# Patient Record
Sex: Female | Born: 1955 | Race: White | Hispanic: No | Marital: Married | Smoking: Never smoker
Health system: Southern US, Community
[De-identification: ages and names within clinical notes are randomized; demographics above are authoritative.]

## PROBLEM LIST (undated history)

## (undated) DIAGNOSIS — S82891A Other fracture of right lower leg, initial encounter for closed fracture: Secondary | ICD-10-CM

## (undated) DIAGNOSIS — J45909 Unspecified asthma, uncomplicated: Secondary | ICD-10-CM

## (undated) DIAGNOSIS — R87619 Unspecified abnormal cytological findings in specimens from cervix uteri: Secondary | ICD-10-CM

## (undated) DIAGNOSIS — I10 Essential (primary) hypertension: Secondary | ICD-10-CM

## (undated) DIAGNOSIS — R928 Other abnormal and inconclusive findings on diagnostic imaging of breast: Secondary | ICD-10-CM

## (undated) DIAGNOSIS — G5603 Carpal tunnel syndrome, bilateral upper limbs: Secondary | ICD-10-CM

## (undated) DIAGNOSIS — K219 Gastro-esophageal reflux disease without esophagitis: Secondary | ICD-10-CM

## (undated) DIAGNOSIS — E119 Type 2 diabetes mellitus without complications: Secondary | ICD-10-CM

## (undated) HISTORY — PX: WRIST SURGERY: SHX841

## (undated) HISTORY — PX: TONSILLECTOMY: SUR1361

## (undated) HISTORY — PX: OVARIAN CYST REMOVAL: SHX89

## (undated) HISTORY — DX: Type 2 diabetes mellitus without complications: E11.9

## (undated) HISTORY — PX: CERVICAL CONE BIOPSY: SUR198

## (undated) HISTORY — DX: Gastro-esophageal reflux disease without esophagitis: K21.9

## (undated) HISTORY — PX: NECK SURGERY: SHX720

## (undated) HISTORY — DX: Essential (primary) hypertension: I10

## (undated) HISTORY — DX: Unspecified abnormal cytological findings in specimens from cervix uteri: R87.619

## (undated) HISTORY — DX: Unspecified asthma, uncomplicated: J45.909

## (undated) HISTORY — PX: NASAL SEPTUM SURGERY: SHX37

## (undated) HISTORY — DX: Other fracture of right lower leg, initial encounter for closed fracture: S82.891A

## (undated) HISTORY — DX: Carpal tunnel syndrome, bilateral upper limbs: G56.03

## (undated) HISTORY — DX: Other abnormal and inconclusive findings on diagnostic imaging of breast: R92.8

---

## 2007-10-02 ENCOUNTER — Encounter: Admission: RE | Admit: 2007-10-02 | Discharge: 2007-10-02 | Payer: Self-pay | Admitting: Neurological Surgery

## 2007-10-11 ENCOUNTER — Ambulatory Visit (HOSPITAL_COMMUNITY): Admission: RE | Admit: 2007-10-11 | Discharge: 2007-10-12 | Payer: Self-pay | Admitting: Neurological Surgery

## 2007-11-14 ENCOUNTER — Encounter: Admission: RE | Admit: 2007-11-14 | Discharge: 2007-11-14 | Payer: Self-pay | Admitting: Neurological Surgery

## 2008-01-15 ENCOUNTER — Encounter: Admission: RE | Admit: 2008-01-15 | Discharge: 2008-01-15 | Payer: Self-pay | Admitting: Neurological Surgery

## 2009-05-29 ENCOUNTER — Ambulatory Visit: Payer: Self-pay | Admitting: Radiology

## 2009-05-29 ENCOUNTER — Emergency Department (HOSPITAL_BASED_OUTPATIENT_CLINIC_OR_DEPARTMENT_OTHER): Admission: EM | Admit: 2009-05-29 | Discharge: 2009-05-29 | Payer: Self-pay | Admitting: Emergency Medicine

## 2010-06-30 NOTE — Op Note (Signed)
Lacey Johns, Lacey Johns                  ACCOUNT NO.:  [id number]   MEDICAL RECORD NO.:  [id number]          PATIENT TYPE:  OIB   LOCATION:  3599                         FACILITY:  [facility]   PHYSICIAN:  Tia Alert, MD     DATE OF BIRTH:  24-Oct-1955   DATE OF PROCEDURE:  10/11/2007  DATE OF DISCHARGE:                               OPERATIVE REPORT   PREOPERATIVE DIAGNOSIS:  Cervical spondylosis C4-5, C5-6 with neck and  right arm pain.   POSTOPERATIVE DIAGNOSIS:  Cervical spondylosis C4-5, C5-6 with neck and  right arm pain.   PROCEDURES:  1. Decompressive anterior cervical diskectomy C4-5, C5-6.  2. Anterior cervical arthrodesis C4-5, C5-6 utilizing a 6-mm      corticocancellous allograft at C4-5 and a 7-mm corticocancellous      allograft at C5-6.  3. Anterior cervical plating utilizing a 37-1/2 mm Atlantis Venture      plate.   SURGEON:  Tia Alert, MD   ASSISTANT:  Donalee Citrin, MD   ANESTHESIA:  General endotracheal.   COMPLICATIONS:  None.   INDICATION FOR PROCEDURE:  Lacey Johns is a very pleasant 55 year old  female, who is referred with significant neck pain with radiation down  to right arm.  She had an MRI and a CT scan which showed cervical  spondylosis with a reversal of lordosis C4-5, C5-6.  Her worst level is  C5-6.  She had neuroforaminal stenosis C5-6 on the right side.  C6-7  looked clean.  I recommended two-level anterior cervical diskectomy  fusion plating at C4-5, C5-6.  She had tried medical management for  quite sometime without significant relief.  She understood the risks,  benefits, expected outcome and wished to proceed.   DESCRIPTION OF PROCEDURE:  The patient was taken to operating room and  after induction of adequate generalized endotracheal anesthesia, she was  placed in supine position on the operating room table.  Her right  anterior cervical region was prepped with DuraPrep and draped in the  usual sterile fashion.  A 5 mL of local anesthesia  was injected and  transverse incision was made to the right of midline and carried down to  the platysma which was elevated, opened, and undermined.  With  Metzenbaum scissors, I then dissected out medial to the  sternocleidomastoid muscle, internal carotid artery and lateral to the  trachea and esophagus to expose C4-5, C5-6.  Intraoperative fluoroscopy  confirmed my level.  The anterior osteophytes were removed with a  Leksell rongeur at C5-6 and then the longus colli muscles were taken  down and the shadow line retractors were placed under these to expose C4-  5, C5-6 and incised the annulus and the initial diskectomy was done with  pituitary rongeurs and curved curettes.  I then used the 3-mm Kerrison  punch to remove the anterior inferior lip of C4-C5, this widened the  disk space.  I then widened the disk space further utilizing the high-  speed drill, drilling the endplates to prepare for later arthrodesis.  We went to the operating microscope and at each level,  performed the  exact same decompression and opened the posterior longitudinal ligament  with a nerve hook and then removed it in a circumferential fashion while  undercutting the bodies of C4-C5 and C5-C6 in succession.  We marched  along the superior endplates of C5-C6 until we identified the pedicles,  we marched along the pedicles, decompressing the nerve root as we went  along.  Once we had the decompression complete, we could see the cord  pulsatile through the dura and the dura was full and capacious all the  way across.  We palpated in a circumferential fashion with a nerve hook  to assure adequate decompression.  We palpated into the foramina, we  could see each nerve root, nicely palpated and made sure there was no  compression of the nerve roots.  I then measured the interspace to be 6  and 7 mm at C4-5, C5-6 respectively.  I used the respective  corticocancellous allografts and tapped these into position at C4-5,  C5-  6.  I then used a 37-1/2 mm Atlantis Venture plate in placed, two 13-mm  variable angle screws into the bodies of C4, C5, and C6 and these locked  in the plate by locking mechanism within the plate.  I then irrigated  with saline solution, dried all bleeding points with Surgifoam and with  bipolar cautery and once meticulous hemostasis was achieved, closed the  platysma with 3-0 Vicryl, closing subcuticular tissue with 3-0 Vicryl,  and closed the skin with Benzoin and Steri-Strips.  The drapes were  removed.  A sterile dressing was applied.  The patient was awakened from  general anesthesia and transferred to recovery room in stable condition.  At the end of procedure, all sponges, needle and instrument counts were  correct.       Tia Alert, MD  Electronically Signed     DSJ/MEDQ  D:  10/11/2007  T:  10/12/2007  Job:  [id number]

## 2012-02-16 LAB — HM DEXA SCAN: HM DEXA SCAN: NORMAL

## 2013-04-02 ENCOUNTER — Encounter: Payer: Self-pay | Admitting: Family Medicine

## 2013-04-02 ENCOUNTER — Ambulatory Visit (INDEPENDENT_AMBULATORY_CARE_PROVIDER_SITE_OTHER): Payer: 59 | Admitting: Family Medicine

## 2013-04-02 ENCOUNTER — Encounter (INDEPENDENT_AMBULATORY_CARE_PROVIDER_SITE_OTHER): Payer: Self-pay

## 2013-04-02 ENCOUNTER — Ambulatory Visit (HOSPITAL_BASED_OUTPATIENT_CLINIC_OR_DEPARTMENT_OTHER)
Admission: RE | Admit: 2013-04-02 | Discharge: 2013-04-02 | Disposition: A | Discharge status: Home / Self Care | Payer: 59 | Source: Ambulatory Visit | Attending: Family Medicine | Admitting: Family Medicine

## 2013-04-02 VITALS — BP 111/72 | HR 69 | Ht 64.0 in | Wt 170.0 lb

## 2013-04-02 DIAGNOSIS — S99921A Unspecified injury of right foot, initial encounter: Secondary | ICD-10-CM

## 2013-04-02 DIAGNOSIS — S99911A Unspecified injury of right ankle, initial encounter: Secondary | ICD-10-CM

## 2013-04-02 DIAGNOSIS — S92309A Fracture of unspecified metatarsal bone(s), unspecified foot, initial encounter for closed fracture: Secondary | ICD-10-CM | POA: Insufficient documentation

## 2013-04-02 DIAGNOSIS — X58XXXA Exposure to other specified factors, initial encounter: Secondary | ICD-10-CM | POA: Insufficient documentation

## 2013-04-02 DIAGNOSIS — S82899A Other fracture of unspecified lower leg, initial encounter for closed fracture: Secondary | ICD-10-CM | POA: Insufficient documentation

## 2013-04-02 DIAGNOSIS — S8990XA Unspecified injury of unspecified lower leg, initial encounter: Secondary | ICD-10-CM

## 2013-04-02 DIAGNOSIS — S99929A Unspecified injury of unspecified foot, initial encounter: Secondary | ICD-10-CM

## 2013-04-02 DIAGNOSIS — S99919A Unspecified injury of unspecified ankle, initial encounter: Secondary | ICD-10-CM

## 2013-04-02 MED ORDER — HYDROCODONE-ACETAMINOPHEN 5-325 MG PO TABS: 1.0000 | ORAL_TABLET | Freq: Four times a day (QID) | ORAL | Status: DC | PRN

## 2013-04-02 NOTE — Patient Instructions (Signed)
You have avulsion fractures of the base of your 5th metatarsal and lateral malleolus These can take total of 6-8 weeks to recover from. Ice the area for 15 minutes at a time, 3-4 times a day Aleve 2 tabs twice a day with food OR ibuprofen 3 tabs three times a day with food for pain and inflammation. Norco as needed for severe pain (no driving on this or with boot on). Elevate above the level of your heart when possible Crutches if needed to help with walking Bear weight when tolerated Wear cam boot whenever up and walking around. Come out of boot at least a couple times a day to do simple motion exercises of ankle. Follow up in 3-5 weeks (whenever pain is much better in this range).

## 2013-04-04 ENCOUNTER — Encounter: Payer: Self-pay | Admitting: Family Medicine

## 2013-04-04 DIAGNOSIS — S99921A Unspecified injury of right foot, initial encounter: Secondary | ICD-10-CM | POA: Insufficient documentation

## 2013-04-04 DIAGNOSIS — S99911A Unspecified injury of right ankle, initial encounter: Secondary | ICD-10-CM | POA: Insufficient documentation

## 2013-04-04 NOTE — Assessment & Plan Note (Signed)
radiographs show avulsion fractures of the base of the 5th metatarsal and lateral malleolus.  Both should do well with conservative treatment over total of 6-8 weeks.  Icing, nsaids with norco as needed.  Cam walker with weight bearing as tolerated.  F/u in 3-5 weeks.  Consider repeating radiographs depending on how clinically healed she is.

## 2013-04-04 NOTE — Progress Notes (Signed)
Patient ID: Lacey Johns, female   DOB: 06/07/1955, 58 y.o.   MRN: [id number]  PCP: No PCP Per Patient  Subjective:   HPI: Patient is a 58 y.o. female here for right ankle injury.  Patient reports about 3 weeks ago on 1/28 she stepped onto a curb and accidentally rolled her right ankle (inversion). Sharp pain and lots of sounds lateral ankle, foot. Unable to bear weight. Had x-rays at an urgent care that were negative. Has history of sprain of this ankle. Has been elevating, took prednisone for upper respiratory issues. Taking ibuprofen and icing. Still difficulty bearing weight.  Past Medical History  Diagnosis Date  . Asthma   . Diabetes mellitus without complication   . Hypertension     No current outpatient prescriptions on file prior to visit.   No current facility-administered medications on file prior to visit.    Past Surgical History  Procedure Laterality Date  . Tonsillectomy    . Ovarian cyst removal    . Cervical cone biopsy    . Wrist surgery      No Known Allergies  History   Social History  . Marital Status: Married    Spouse Name: N/A    Number of Children: N/A  . Years of Education: N/A   Occupational History  . Not on file.   Social History Main Topics  . Smoking status: Never Smoker   . Smokeless tobacco: Not on file  . Alcohol Use: Not on file  . Drug Use: Not on file  . Sexual Activity: Not on file   Other Topics Concern  . Not on file   Social History Narrative  . No narrative on file    Family History  Problem Relation Age of Onset  . Hypertension Mother   . Hypertension Father   . Hyperlipidemia Father   . Diabetes Father     BP 111/72  Pulse 69  Ht 5\' 4"  (1.626 m)  Wt 170 lb (77.111 kg)  BMI 29.17 kg/m2  Review of Systems: See HPI above.    Objective:  Physical Exam:  Gen: NAD  Right ankle/foot: Mild-mod swelling lateral foot/ankle and dorsal foot.  Some bruising dorsal foot also.  No other  deformity. Mild limitation ROM all directions. TTP over ATFL, lateral malleolus, base 5th MT.  No fibular head, other foot/ankle tenderness. Negative ant drawer and talar tilt.  Pain with talar tilt though.  Negative syndesmotic compression. Thompsons test negative. NV intact distally.    Assessment & Plan:  1. Right foot/ankle injuries - radiographs show avulsion fractures of the base of the 5th metatarsal and lateral malleolus.  Both should do well with conservative treatment over total of 6-8 weeks.  Icing, nsaids with norco as needed.  Cam walker with weight bearing as tolerated.  F/u in 3-5 weeks.  Consider repeating radiographs depending on how clinically healed she is.

## 2013-04-04 NOTE — Assessment & Plan Note (Signed)
radiographs show avulsion fractures of the base of the 5th metatarsal and lateral malleolus.  Both should do well with conservative treatment over total of 6-8 weeks.  Icing, nsaids with norco as needed.  Cam walker with weight bearing as tolerated.  F/u in 3-5 weeks.  Consider repeating radiographs depending on how clinically healed she is. 

## 2013-05-25 ENCOUNTER — Ambulatory Visit (HOSPITAL_BASED_OUTPATIENT_CLINIC_OR_DEPARTMENT_OTHER)
Admission: RE | Admit: 2013-05-25 | Discharge: 2013-05-25 | Disposition: A | Discharge status: Home / Self Care | Payer: 59 | Source: Ambulatory Visit | Attending: Family Medicine | Admitting: Family Medicine

## 2013-05-25 ENCOUNTER — Ambulatory Visit (INDEPENDENT_AMBULATORY_CARE_PROVIDER_SITE_OTHER): Payer: 59 | Admitting: Family Medicine

## 2013-05-25 ENCOUNTER — Encounter (INDEPENDENT_AMBULATORY_CARE_PROVIDER_SITE_OTHER): Payer: Self-pay

## 2013-05-25 ENCOUNTER — Encounter: Payer: Self-pay | Admitting: Family Medicine

## 2013-05-25 VITALS — BP 99/67 | HR 81 | Ht 64.0 in | Wt 170.0 lb

## 2013-05-25 DIAGNOSIS — M25579 Pain in unspecified ankle and joints of unspecified foot: Secondary | ICD-10-CM | POA: Insufficient documentation

## 2013-05-25 DIAGNOSIS — S99911A Unspecified injury of right ankle, initial encounter: Secondary | ICD-10-CM

## 2013-05-25 DIAGNOSIS — M25571 Pain in right ankle and joints of right foot: Secondary | ICD-10-CM

## 2013-05-25 DIAGNOSIS — S99929A Unspecified injury of unspecified foot, initial encounter: Secondary | ICD-10-CM

## 2013-05-25 DIAGNOSIS — S99919A Unspecified injury of unspecified ankle, initial encounter: Secondary | ICD-10-CM

## 2013-05-25 DIAGNOSIS — S8990XA Unspecified injury of unspecified lower leg, initial encounter: Secondary | ICD-10-CM

## 2013-05-25 NOTE — Patient Instructions (Signed)
We will go ahead with a CT scan to assess for a nonunion of this lateral malleolus fracture.   I will contact you with results (typically the next business day, sometimes same day) and next steps.

## 2013-05-28 ENCOUNTER — Encounter: Payer: Self-pay | Admitting: Family Medicine

## 2013-05-28 NOTE — Assessment & Plan Note (Signed)
Repeated ankle radiographs today and does not appear changed now 2 1/2 months out from distal fibula avulsion fracture.  Clinically avulsion of base 5th has healed.  Icing, nsaids with norco as needed.  Unfortunately has not been wearing cam walker regularly.  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  CT scan performed today showed that to be the case.  Will refer to ortho for further treatment discussion (non weight bearing, bone stimulator, ORIF).

## 2013-05-28 NOTE — Progress Notes (Signed)
Patient ID: Lacey Johns, female   DOB: December 01, 1955, 58 y.o.   MRN: [id number]  PCP: No PCP Per Patient  Subjective:   HPI: Patient is a 58 y.o. female here for right ankle injury.  2/16: Patient reports about 3 weeks ago on 1/28 she stepped onto a curb and accidentally rolled her right ankle (inversion). Sharp pain and lots of sounds lateral ankle, foot. Unable to bear weight. Had x-rays at an urgent care that were negative. Has history of sprain of this ankle. Has been elevating, took prednisone for upper respiratory issues. Taking ibuprofen and icing. Still difficulty bearing weight.  4/10: Patient reports her foot feels better. She only used cam walker for 2 weeks however from last visit and then went to using an ace wrap. Has been bearing full weight. Pain worse by end of day.  Past Medical History  Diagnosis Date  . Asthma   . Diabetes mellitus without complication   . Hypertension     Current Outpatient Prescriptions on File Prior to Visit  Medication Sig Dispense Refill  . albuterol (PROVENTIL HFA;VENTOLIN HFA) 108 (90 BASE) MCG/ACT inhaler Inhale into the lungs every 6 (six) hours as needed for wheezing or shortness of breath.      . lisinopril (PRINIVIL,ZESTRIL) 10 MG tablet Take 10 mg by mouth daily.      . pantoprazole (PROTONIX) 20 MG tablet Take 20 mg by mouth daily.       No current facility-administered medications on file prior to visit.    Past Surgical History  Procedure Laterality Date  . Tonsillectomy    . Ovarian cyst removal    . Cervical cone biopsy    . Wrist surgery      No Known Allergies  History   Social History  . Marital Status: Married    Spouse Name: N/A    Number of Children: N/A  . Years of Education: N/A   Occupational History  . Not on file.   Social History Main Topics  . Smoking status: Never Smoker   . Smokeless tobacco: Not on file  . Alcohol Use: Not on file  . Drug Use: Not on file  . Sexual Activity: Not on  file   Other Topics Concern  . Not on file   Social History Narrative  . No narrative on file    Family History  Problem Relation Age of Onset  . Hypertension Mother   . Hypertension Father   . Hyperlipidemia Father   . Diabetes Father     BP 99/67  Pulse 81  Ht 5\' 4"  (1.626 m)  Wt 170 lb (77.111 kg)  BMI 29.17 kg/m2  Review of Systems: See HPI above.    Objective:  Physical Exam:  Gen: NAD  Right ankle/foot: Mild swelling lateral ankle.  No bruising.  No other deformity. Mild limitation ROM all directions. TTP lateral malleolus but no tenderness base 5th MT.  No fibular head, other foot/ankle tenderness. Negative ant drawer and talar tilt.  Negative syndesmotic compression. Thompsons test negative. NV intact distally.    Assessment & Plan:  1. Right foot/ankle injuries - Repeated ankle radiographs today and does not appear changed now 2 1/2 months out from distal fibula avulsion fracture.  Clinically avulsion of base 5th has healed.  Icing, nsaids with norco as needed.  Unfortunately has not been wearing cam walker regularly.  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  CT scan performed today showed that to be the  case.  Will refer to ortho for further treatment discussion (non weight bearing, bone stimulator, ORIF).

## 2013-08-15 ENCOUNTER — Encounter: Payer: Self-pay | Admitting: Internal Medicine

## 2013-08-15 ENCOUNTER — Ambulatory Visit (INDEPENDENT_AMBULATORY_CARE_PROVIDER_SITE_OTHER): Payer: 59 | Admitting: Internal Medicine

## 2013-08-15 VITALS — BP 113/75 | HR 81 | Resp 16 | Ht 64.0 in | Wt 176.0 lb

## 2013-08-15 DIAGNOSIS — J45990 Exercise induced bronchospasm: Secondary | ICD-10-CM

## 2013-08-15 DIAGNOSIS — Z803 Family history of malignant neoplasm of breast: Secondary | ICD-10-CM

## 2013-08-15 DIAGNOSIS — G5603 Carpal tunnel syndrome, bilateral upper limbs: Secondary | ICD-10-CM

## 2013-08-15 DIAGNOSIS — K219 Gastro-esophageal reflux disease without esophagitis: Secondary | ICD-10-CM

## 2013-08-15 DIAGNOSIS — F4322 Adjustment disorder with anxiety: Secondary | ICD-10-CM

## 2013-08-15 DIAGNOSIS — G5600 Carpal tunnel syndrome, unspecified upper limb: Secondary | ICD-10-CM | POA: Insufficient documentation

## 2013-08-15 DIAGNOSIS — I10 Essential (primary) hypertension: Secondary | ICD-10-CM | POA: Insufficient documentation

## 2013-08-15 DIAGNOSIS — C539 Malignant neoplasm of cervix uteri, unspecified: Secondary | ICD-10-CM

## 2013-08-15 DIAGNOSIS — R454 Irritability and anger: Secondary | ICD-10-CM

## 2013-08-15 LAB — COMPREHENSIVE METABOLIC PANEL
ALBUMIN: 4.6 g/dL (ref 3.5–5.2)
ALK PHOS: 83 U/L (ref 39–117)
ALT: 18 U/L (ref 0–35)
AST: 16 U/L (ref 0–37)
BILIRUBIN TOTAL: 0.5 mg/dL (ref 0.2–1.2)
BUN: 17 mg/dL (ref 6–23)
CO2: 28 mEq/L (ref 19–32)
Calcium: 9.9 mg/dL (ref 8.4–10.5)
Chloride: 100 mEq/L (ref 96–112)
Creat: 0.89 mg/dL (ref 0.50–1.10)
GLUCOSE: 97 mg/dL (ref 70–99)
Potassium: 4.1 mEq/L (ref 3.5–5.3)
SODIUM: 138 meq/L (ref 135–145)
TOTAL PROTEIN: 7.1 g/dL (ref 6.0–8.3)

## 2013-08-15 LAB — LIPID PANEL
CHOL/HDL RATIO: 3.8 ratio
Cholesterol: 230 mg/dL — ABNORMAL HIGH (ref 0–200)
HDL: 60 mg/dL (ref >39)
LDL Cholesterol: 136 mg/dL — ABNORMAL HIGH (ref 0–99)
Triglycerides: 169 mg/dL — ABNORMAL HIGH (ref <150)
VLDL: 34 mg/dL (ref 0–40)

## 2013-08-15 LAB — TSH: TSH: 2.501 u[IU]/mL (ref 0.350–4.500)

## 2013-08-15 LAB — CBC WITH DIFFERENTIAL/PLATELET
BASOS ABS: 0.1 10*3/uL (ref 0.0–0.1)
BASOS PCT: 1 % (ref 0–1)
EOS ABS: 0.3 10*3/uL (ref 0.0–0.7)
EOS PCT: 5 % (ref 0–5)
HCT: 43 % (ref 36.0–46.0)
Hemoglobin: 14.7 g/dL (ref 12.0–15.0)
Lymphocytes Relative: 38 % (ref 12–46)
Lymphs Abs: 2.3 10*3/uL (ref 0.7–4.0)
MCH: 29.6 pg (ref 26.0–34.0)
MCHC: 34.2 g/dL (ref 30.0–36.0)
MCV: 86.5 fL (ref 78.0–100.0)
MONO ABS: 0.5 10*3/uL (ref 0.1–1.0)
MONOS PCT: 8 % (ref 3–12)
NEUTROS ABS: 2.9 10*3/uL (ref 1.7–7.7)
Neutrophils Relative %: 48 % (ref 43–77)
PLATELETS: 347 10*3/uL (ref 150–400)
RBC: 4.97 MIL/uL (ref 3.87–5.11)
RDW: 14.1 % (ref 11.5–15.5)
WBC: 6.1 10*3/uL (ref 4.0–10.5)

## 2013-08-15 MED ORDER — LISINOPRIL-HYDROCHLOROTHIAZIDE 10-12.5 MG PO TABS: 1.0000 | ORAL_TABLET | Freq: Every day | ORAL | Status: DC

## 2013-08-15 MED ORDER — PANTOPRAZOLE SODIUM 20 MG PO TBEC: 20.0000 mg | DELAYED_RELEASE_TABLET | Freq: Every day | ORAL | Status: DC

## 2013-08-15 MED ORDER — ESCITALOPRAM OXALATE 5 MG PO TABS: ORAL_TABLET | ORAL | Status: DC

## 2013-08-15 NOTE — Patient Instructions (Signed)
Schedule 3D mammogram at breast center  To lab today    Schedule CPE in 02/2014  Follow up with me in 6-8 weeks  30 mins

## 2013-08-15 NOTE — Progress Notes (Signed)
Subjective:    Patient ID: Lacey Johns, female    DOB: 1955-07-23, 58 y.o.   MRN: [id number]  HPI Lacey Johns is here as a new pt first visit for primary care.  Former care [facility]  PMH of fibrocystic breast disease,  abnromal pap (CA in situ per pt )  Treated with conization, HTN, GERD, exercise induced asthma, bilateral carpal tunnel.    She has a strong FH of brest cancer in mother, maternal aunt and MGM.    She has multiple concerns today.  She believes she feels a lump under right axilla.  Been present many months and she states it goes up and down in size.   She cannot feel it today.   She also has some pain "around by belly button" and wonders if she has a hernia.  She is pain free now  She has multiple stresses  Lots of financial stress husband had lost job but recently restarted,  1 child in college and 1 to start.  Very stressful job with demanding boss.  Lots of irritability  "hard to shut down at night".    She denies daily derpessed mood  No S/H ideation  No Known Allergies Past Medical History  Diagnosis Date  . Asthma   . Diabetes mellitus without complication   . Hypertension   . Carpal tunnel syndrome, bilateral   . Ankle fracture, right   . Abnormal mammogram of right breast   . Abnormal Pap smear of cervix   . GERD (gastroesophageal reflux disease)    Past Surgical History  Procedure Laterality Date  . Tonsillectomy    . Ovarian cyst removal    . Cervical cone biopsy    . Wrist surgery    . Nasal septum surgery    . Neck surgery     History   Social History  . Marital Status: Married    Spouse Name: N/A    Number of Children: N/A  . Years of Education: N/A   Occupational History  . Not on file.   Social History Main Topics  . Smoking status: Never Smoker   . Smokeless tobacco: Not on file  . Alcohol Use: No  . Drug Use: No  . Sexual Activity: Yes    Partners: Male   Other Topics Concern  . Not on file   Social History Narrative  . No  narrative on file   Family History  Problem Relation Age of Onset  . Hypertension Mother   . Hypertension Father   . Hyperlipidemia Father   . Diabetes Father   . Breast cancer Maternal Aunt   . ALS Maternal Aunt   . Parkinson's disease Maternal Aunt    Patient Active Problem List   Diagnosis Date Noted  . Right ankle injury 04/04/2013  . Right foot injury 04/04/2013   Current Outpatient Prescriptions on File Prior to Visit  Medication Sig Dispense Refill  . albuterol (PROVENTIL HFA;VENTOLIN HFA) 108 (90 BASE) MCG/ACT inhaler Inhale into the lungs every 6 (six) hours as needed for wheezing or shortness of breath.      . lisinopril (PRINIVIL,ZESTRIL) 10 MG tablet Take 10 mg by mouth daily.       No current facility-administered medications on file prior to visit.       Review of Systems See HPI    Objective:   Physical Exam Physical Exam  Nursing note and vitals reviewed.  Constitutional: She is oriented to person, place, and time. She appears well-developed  and well-nourished.  HENT:  Head: Normocephalic and atraumatic.  Cardiovascular: Normal rate and regular rhythm. Exam reveals no gallop and no friction rub.  No murmur heard.  Pulmonary/Chest: Breath sounds normal. She has no wheezes. She has no rales.  Breast  Careful exam of both breast and bilateral axillary areas reveal no mass no nipple discharge and I cannot palpate any axillary mass or lymphaedenopathy is either breast  Abd .  She has diastasis recti.  No peri-umbilical herniation on exam.  BS Pos  No HSM Neurological: She is alert and oriented to person, place, and time.  Skin: Skin is warm and dry.  Psychiatric: She has a normal mood and affect. Her behavior is normal.              Assessment & Plan:  HTN  Continue meds  Irritabilty  /situational stress/ anxious mood  Will try lexapro 5 mg  Daily for two weeks, then 10 mg  Screening strong FH of breast CA  Will schedule 3 D mm.  I cannot  palpate any axillary mass  GERD  She had been using OTC will start Protonix  20 mg daily    Abnormal pap ?? CA in situ of cervix.  Will need to schedule  CPe for pap Get labs today return 6-8 weeks

## 2013-08-16 ENCOUNTER — Encounter: Payer: Self-pay | Admitting: *Deleted

## 2013-08-16 LAB — VITAMIN D 25 HYDROXY (VIT D DEFICIENCY, FRACTURES): Vit D, 25-Hydroxy: 57 ng/mL (ref 30–89)

## 2013-08-23 ENCOUNTER — Ambulatory Visit (HOSPITAL_COMMUNITY): Payer: 59

## 2013-08-27 ENCOUNTER — Ambulatory Visit (HOSPITAL_COMMUNITY)
Admission: RE | Admit: 2013-08-27 | Discharge: 2013-08-27 | Disposition: A | Discharge status: Home / Self Care | Payer: 59 | Source: Ambulatory Visit | Attending: Internal Medicine | Admitting: Internal Medicine

## 2013-08-27 DIAGNOSIS — Z1231 Encounter for screening mammogram for malignant neoplasm of breast: Secondary | ICD-10-CM | POA: Insufficient documentation

## 2013-08-31 ENCOUNTER — Encounter: Payer: Self-pay | Admitting: *Deleted

## 2013-09-10 ENCOUNTER — Ambulatory Visit: Payer: Self-pay | Admitting: Internal Medicine

## 2013-10-01 ENCOUNTER — Ambulatory Visit (INDEPENDENT_AMBULATORY_CARE_PROVIDER_SITE_OTHER): Payer: 59 | Admitting: Internal Medicine

## 2013-10-01 ENCOUNTER — Encounter: Payer: Self-pay | Admitting: Internal Medicine

## 2013-10-01 VITALS — BP 104/67 | HR 66 | Temp 98.1°F | Resp 16 | Ht 64.0 in | Wt 178.0 lb

## 2013-10-01 DIAGNOSIS — M545 Low back pain, unspecified: Secondary | ICD-10-CM

## 2013-10-01 DIAGNOSIS — S39012A Strain of muscle, fascia and tendon of lower back, initial encounter: Secondary | ICD-10-CM

## 2013-10-01 DIAGNOSIS — R454 Irritability and anger: Secondary | ICD-10-CM

## 2013-10-01 DIAGNOSIS — F4322 Adjustment disorder with anxiety: Secondary | ICD-10-CM

## 2013-10-01 MED ORDER — ESCITALOPRAM OXALATE 5 MG PO TABS
ORAL_TABLET | ORAL | Status: DC
Start: 2013-10-01 — End: 2014-03-01

## 2013-10-01 MED ORDER — CYCLOBENZAPRINE HCL 10 MG PO TABS: ORAL_TABLET | ORAL | Status: DC

## 2013-10-01 NOTE — Progress Notes (Signed)
Subjective:    Patient ID: Lacey Johns, female    DOB: [birth year], 58 y.o.   MRN: [id number]  HPI  Lacey Johns is here for follow up   I had initiated Lexapro last visit for anxiety/irritability and she tells me she is 80% improved.  Much better  See cholesterol  She does not want RX  Meds.  Framingham risk score 4.8%  She reports she was seen at [facility] for low back pain  I do not have records  - she reports urine test Ok .  Was given muscle relaxant but pain still present .  Located L/S area occasionally will radiate upward.  No radiation down either leg.   No paresthesias or muscle weakness.   Flexeril has worked for her in the past  She states she cannot lose weight despite diet .   Hard to exercise now.         No Known Allergies Past Medical History  Diagnosis Date  . Asthma   . Diabetes mellitus without complication   . Hypertension   . Carpal tunnel syndrome, bilateral   . Ankle fracture, right   . Abnormal mammogram of right breast   . Abnormal Pap smear of cervix   . GERD (gastroesophageal reflux disease)    Past Surgical History  Procedure Laterality Date  . Tonsillectomy    . Ovarian cyst removal    . Cervical cone biopsy    . Wrist surgery    . Nasal septum surgery    . Neck surgery     History   Social History  . Marital Status: Married    Spouse Name: Lacey Johns    Number of Children: Lacey Johns  . Years of Education: Lacey Johns   Occupational History  . Not on file.   Social History Main Topics  . Smoking status: Never Smoker   . Smokeless tobacco: Not on file  . Alcohol Use: No  . Drug Use: No  . Sexual Activity: Yes    Partners: Male   Other Topics Concern  . Not on file   Social History Narrative  . No narrative on file   Family History  Problem Relation Age of Onset  . Hypertension Mother   . Hypertension Father   . Hyperlipidemia Father   . Diabetes Father   . Breast cancer Maternal Aunt   . ALS Maternal Aunt   . Parkinson's disease Maternal Aunt     Patient Active Problem List   Diagnosis Date Noted  . HTN (hypertension) 08/15/2013  . Irritability 08/15/2013  . Adjustment disorder with anxious mood 08/15/2013  . FH: breast cancer in first degree relative  Mother , GM, maunt 08/15/2013  . 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 GERD (gastroesophageal reflux disease) 08/15/2013  . Exercise-induced asthma 08/15/2013  . Carpal tunnel syndrome 08/15/2013  . Right ankle injury 04/04/2013  . Right foot injury 04/04/2013   Current Outpatient Prescriptions on File Prior to Visit  Medication Sig Dispense Refill  . albuterol (PROVENTIL HFA;VENTOLIN HFA) 108 (90 BASE) MCG/ACT inhaler Inhale into the lungs every 6 (six) hours as needed for wheezing or shortness of breath.      . Calcium Citrate-Vitamin D (CITRACAL/VITAMIN D) 250-200 MG-UNIT TABS Take 2 tablets by mouth daily.      . cyclobenzaprine (FLEXERIL) 10 MG tablet Take 10 mg by mouth 3 (three) times daily as needed for muscle spasms.      Marland Kitchen escitalopram (LEXAPRO)  5 MG tablet Take one tablet daily for 2 weeks then 2 tablets daily  60 tablet  1  . lisinopril (PRINIVIL,ZESTRIL) 10 MG tablet Take 10 mg by mouth daily.      Marland Kitchen lisinopril-hydrochlorothiazide (PRINZIDE,ZESTORETIC) 10-12.5 MG per tablet Take 1 tablet by mouth daily.  90 tablet  0  . pantoprazole (PROTONIX) 20 MG tablet Take 1 tablet (20 mg total) by mouth daily.  30 tablet  2  . valACYclovir (VALTREX) 500 MG tablet Take 500 mg by mouth 2 (two) times daily.      . Vitamin D, Ergocalciferol, (DRISDOL) 50000 UNITS CAPS capsule Take 50,000 Units by mouth every 7 (seven) days.       No current facility-administered medications on file prior to visit.      Review of Systems See HPI    Objective:   Physical Exam  Physical Exam  Nursing note and vitals reviewed.  Constitutional: She is oriented to person, place, and time. She appears well-developed and well-nourished.  HENT:  Head:  Normocephalic and atraumatic.  Cardiovascular: Normal rate and regular rhythm. Exam reveals no gallop and no friction rub.  No murmur heard.  Pulmonary/Chest: Breath sounds normal. She has no wheezes. She has no rales.  Neurological: She is alert and oriented to person, place, and time.  Skin: Skin is warm and dry.  Neurologic  LE:  SLR neg bilaterally Reflexes LE:  2+ symmetric Motor :  5/5 LE groups tested Sensory intact microfilament and symmetric  Psychiatric: She has a normal mood and affect. Her behavior is normal.        Assessment & Plan:  Situational anxiety/irritability:  Continue Lexapro 10 mg daily  Hyperlipidemia  Dash diet given  Will moniter   Low back strain  [facility] for flexeril bid prn  Alleve daily for next 10 days.    Will set up with PT    She has seen Dr. Barbaraann Barthel  In the past and I advised to make follow up appt with him .    See me as needed

## 2013-10-01 NOTE — Patient Instructions (Signed)
To go to Pt today   To schedule appt with Dr. Barbaraann Barthel  Pt to make appt    See me as needed

## 2013-12-17 ENCOUNTER — Other Ambulatory Visit: Payer: Self-pay | Admitting: *Deleted

## 2013-12-17 ENCOUNTER — Encounter: Payer: Self-pay | Admitting: Internal Medicine

## 2013-12-17 MED ORDER — CYCLOBENZAPRINE HCL 10 MG PO TABS: ORAL_TABLET | ORAL | Status: DC

## 2013-12-17 MED ORDER — LISINOPRIL-HYDROCHLOROTHIAZIDE 10-12.5 MG PO TABS: 1.0000 | ORAL_TABLET | Freq: Every day | ORAL | Status: DC

## 2013-12-17 NOTE — Telephone Encounter (Signed)
Refill request

## 2014-02-24 NOTE — Progress Notes (Signed)
Subjective:    Patient ID: Lacey Johns, female    DOB: Oct 19, 1955, 59 y.o.   MRN: [id number]  HPI 09/2013 Situational anxiety/irritability: Continue Lexapro 10 mg daily  Hyperlipidemia Dash diet given Will moniter   Low back strain [facility] for flexeril bid prn Alleve daily for next 10 days. Will set up with PT  She has seen Dr. Barbaraann Barthel In the past and I advised to make follow up appt with him .   See me as needed     TODAY:  Juliann Pulse is here for  CPE  HM:  Anxiety  Tolerating Lexapro  No Known Allergies Past Medical History  Diagnosis Date  . Asthma   . Diabetes mellitus without complication   . Hypertension   . Carpal tunnel syndrome, bilateral   . Ankle fracture, right   . Abnormal mammogram of right breast   . Abnormal Pap smear of cervix   . GERD (gastroesophageal reflux disease)    Past Surgical History  Procedure Laterality Date  . Tonsillectomy    . Ovarian cyst removal    . Cervical cone biopsy    . Wrist surgery    . Nasal septum surgery    . Neck surgery     History   Social History  . Marital Status: Married    Spouse Name: N/A    Number of Children: N/A  . Years of Education: N/A   Occupational History  . Not on file.   Social History Main Topics  . Smoking status: Never Smoker   . Smokeless tobacco: Not on file  . Alcohol Use: No  . Drug Use: No  . Sexual Activity:    Partners: Male   Other Topics Concern  . Not on file   Social History Narrative   Family History  Problem Relation Age of Onset  . Hypertension Mother   . Hypertension Father   . Hyperlipidemia Father   . Diabetes Father   . Breast cancer Maternal Aunt   . ALS Maternal Aunt   . Parkinson's disease Maternal Aunt    Patient Active Problem List   Diagnosis Date Noted  . HTN (hypertension) 08/15/2013  . Irritability 08/15/2013  . Adjustment disorder with anxious mood 08/15/2013  . FH: breast cancer in first degree relative  Mother , GM, maunt 08/15/2013    . Cervical cancer  pt reports CA in situ S/P conization college age 59/02/2013  . GERD (gastroesophageal reflux disease) 08/15/2013  . Exercise-induced asthma 08/15/2013  . Carpal tunnel syndrome 08/15/2013  . Right ankle injury 04/04/2013  . Right foot injury 04/04/2013   Current Outpatient Prescriptions on File Prior to Visit  Medication Sig Dispense Refill  . albuterol (PROVENTIL HFA;VENTOLIN HFA) 108 (90 BASE) MCG/ACT inhaler Inhale into the lungs every 6 (six) hours as needed for wheezing or shortness of breath.    . Calcium Citrate-Vitamin D (CITRACAL/VITAMIN D) 250-200 MG-UNIT TABS Take 2 tablets by mouth daily.    . cyclobenzaprine (FLEXERIL) 10 MG tablet Take 1/2 or one whole tablet bid for muscle spasms 30 tablet 1  . escitalopram (LEXAPRO) 5 MG tablet Take two daily 60 tablet 4  . lisinopril (PRINIVIL,ZESTRIL) 10 MG tablet Take 10 mg by mouth daily.    Marland Kitchen lisinopril-hydrochlorothiazide (PRINZIDE,ZESTORETIC) 10-12.5 MG per tablet Take 1 tablet by mouth daily. 90 tablet 1  . pantoprazole (PROTONIX) 20 MG tablet Take 1 tablet (20 mg total) by mouth daily. 30 tablet 2  . valACYclovir (VALTREX) 500 MG tablet Take  500 mg by mouth 2 (two) times daily.    . Vitamin D, Ergocalciferol, (DRISDOL) 50000 UNITS CAPS capsule Take 50,000 Units by mouth every 7 (seven) days.     No current facility-administered medications on file prior to visit.       Review of Systems  Respiratory: Negative for cough, chest tightness, shortness of breath, wheezing and stridor.   Cardiovascular: Negative for chest pain, palpitations and leg swelling.  All other systems reviewed and are negative.      Objective:   Physical Exam Physical Exam  Vital signs and nursing note reviewed  Constitutional: She is oriented to person, place, and time. She appears well-developed and well-nourished. She is cooperative.  HENT:  Head: Normocephalic and atraumatic.  Right Ear: Tympanic membrane normal.  Left Ear:  Tympanic membrane normal.  Nose: Nose normal.  Mouth/Throat: Oropharynx is clear and moist and mucous membranes are normal. No oropharyngeal exudate or posterior oropharyngeal erythema.  Eyes: Conjunctivae and EOM are normal. Pupils are equal, round, and reactive to light.  Neck: Neck supple. No JVD present. Carotid bruit is not present. No mass and no thyromegaly present.  Cardiovascular: Regular rhythm, normal heart sounds, intact distal pulses and normal pulses.  Exam reveals no gallop and no friction rub.   No murmur heard. Pulses:      Dorsalis pedis pulses are 2+ on the right side, and 2+ on the left side.  Pulmonary/Chest: Breath sounds normal. She has no wheezes. She has no rhonchi. She has no rales. Right breast exhibits no mass, no nipple discharge and no skin change. Left breast exhibits no mass, no nipple discharge and no skin change.  Abdominal: Soft. Bowel sounds are normal. She exhibits no distension and no mass. There is no hepatosplenomegaly. There is no tenderness. There is no CVA tenderness.  Genitourinary: Rectum normal, vagina normal and uterus normal. Rectal exam shows no mass. Guaiac negative stool. No labial fusion. There is no lesion on the right labia. There is no lesion on the left labia. Cervix exhibits no motion tenderness. Right adnexum displays no mass, no tenderness and no fullness. Left adnexum displays no mass, no tenderness and no fullness. No erythema around the vagina.  Musculoskeletal:       No active synovitis to any joint.    Lymphadenopathy:       Right cervical: No superficial cervical adenopathy present.      Left cervical: No superficial cervical adenopathy present.       Right axillary: No pectoral and no lateral adenopathy present.       Left axillary: No pectoral and no lateral adenopathy present.      Right: No inguinal adenopathy present.       Left: No inguinal adenopathy present.  Neurological: She is alert and oriented to person, place, and  time. She has normal strength and normal reflexes. No cranial nerve deficit or sensory deficit. She displays a negative Romberg sign. Coordination and gait normal.  Skin: Skin is warm and dry. No abrasion, no bruising, no ecchymosis and no rash noted. No cyanosis. Nails show no clubbing.  Psychiatric: She has a normal mood and affect. Her speech is normal and behavior is normal.          Assessment & Plan:   HM       Assessment & Plan:

## 2014-02-25 ENCOUNTER — Encounter: Payer: Self-pay | Admitting: Internal Medicine

## 2014-03-01 ENCOUNTER — Other Ambulatory Visit: Payer: Self-pay | Admitting: *Deleted

## 2014-03-01 NOTE — Telephone Encounter (Signed)
Refill request

## 2014-03-03 MED ORDER — CYCLOBENZAPRINE HCL 10 MG PO TABS: ORAL_TABLET | ORAL | Status: DC

## 2014-03-03 MED ORDER — LISINOPRIL-HYDROCHLOROTHIAZIDE 10-12.5 MG PO TABS: 1.0000 | ORAL_TABLET | Freq: Every day | ORAL | Status: DC

## 2014-03-03 MED ORDER — ESCITALOPRAM OXALATE 5 MG PO TABS: ORAL_TABLET | ORAL | Status: DC

## 2014-03-04 ENCOUNTER — Other Ambulatory Visit: Payer: Self-pay | Admitting: *Deleted

## 2014-03-04 NOTE — Telephone Encounter (Signed)
Refill request

## 2014-03-05 MED ORDER — PANTOPRAZOLE SODIUM 20 MG PO TBEC: 20.0000 mg | DELAYED_RELEASE_TABLET | Freq: Every day | ORAL | Status: DC

## 2014-03-05 MED ORDER — CYCLOBENZAPRINE HCL 10 MG PO TABS: ORAL_TABLET | ORAL | Status: DC

## 2014-03-10 NOTE — Progress Notes (Signed)
Subjective:    Patient ID: Lacey Johns, female    DOB: [birth year], 59 y.o.   MRN: [id number]  HPI 09/2013  Situational anxiety/irritability: Continue Lexapro 10 mg daily  Hyperlipidemia Dash diet given Will moniter   Low back strain [facility] for flexeril bid prn Alleve daily for next 10 days. Will set up with PT  She has seen Dr. Barbaraann Barthel In the past and I advised to make follow up appt with him .   See me as needed.    TODAY:  [name] returns for follow up .  She states she has noticed some increasing irritability and would like to try a higher dose of Lexapro.   Flexeril working as needed for back strain  Hyperlipdemia  Will check at 12 months   GERD  On Protonix  No Known Allergies Past Medical History  Diagnosis Date  . Asthma   . Diabetes mellitus without complication   . Hypertension   . Carpal tunnel syndrome, bilateral   . Ankle fracture, right   . Abnormal mammogram of right breast   . Abnormal Pap smear of cervix   . GERD (gastroesophageal reflux disease)    Past Surgical History  Procedure Laterality Date  . Tonsillectomy    . Ovarian cyst removal    . Cervical cone biopsy    . Wrist surgery    . Nasal septum surgery    . Neck surgery     History   Social History  . Marital Status: Married    Spouse Name: N/A    Number of Children: N/A  . Years of Education: N/A   Occupational History  . Not on file.   Social History Main Topics  . Smoking status: Never Smoker   . Smokeless tobacco: Not on file  . Alcohol Use: No  . Drug Use: No  . Sexual Activity:    Partners: Male   Other Topics Concern  . Not on file   Social History Narrative   Family History  Problem Relation Age of Onset  . Hypertension Mother   . Hypertension Father   . Hyperlipidemia Father   . Diabetes Father   . Breast cancer Maternal Aunt   . ALS Maternal Aunt   . Parkinson's disease Maternal Aunt    Patient Active Problem List   Diagnosis Date Noted  .  HTN (hypertension) 08/15/2013  . Irritability 08/15/2013  . Adjustment disorder with anxious mood 08/15/2013  . FH: breast cancer in first degree relative  Mother , GM, maunt 08/15/2013  . Cervical cancer  pt reports CA in situ S/P conization college age 60/02/2013  . GERD (gastroesophageal reflux disease) 08/15/2013  . Exercise-induced asthma 08/15/2013  . Carpal tunnel syndrome 08/15/2013  . Right ankle injury 04/04/2013  . Right foot injury 04/04/2013   Current Outpatient Prescriptions on File Prior to Visit  Medication Sig Dispense Refill  . albuterol (PROVENTIL HFA;VENTOLIN HFA) 108 (90 BASE) MCG/ACT inhaler Inhale into the lungs every 6 (six) hours as needed for wheezing or shortness of breath.    . Calcium Citrate-Vitamin D (CITRACAL/VITAMIN D) 250-200 MG-UNIT TABS Take 2 tablets by mouth daily.    . cyclobenzaprine (FLEXERIL) 10 MG tablet Take 1/2 or one whole tablet bid for muscle spasms 30 tablet 0  . escitalopram (LEXAPRO) 5 MG tablet Take two daily 60 tablet 4  . lisinopril-hydrochlorothiazide (PRINZIDE,ZESTORETIC) 10-12.5 MG per tablet Take 1 tablet by mouth daily. 90 tablet 0  . pantoprazole (PROTONIX) 20 MG tablet Take  1 tablet (20 mg total) by mouth daily. 30 tablet 2  . valACYclovir (VALTREX) 500 MG tablet Take 500 mg by mouth 2 (two) times daily.    . Vitamin D, Ergocalciferol, (DRISDOL) 50000 UNITS CAPS capsule Take 50,000 Units by mouth every 7 (seven) days.     No current facility-administered medications on file prior to visit.      Review of Systems See HPI    Objective:   Physical Exam Physical Exam  Nursing note and vitals reviewed.  Constitutional: She is oriented to person, place, and time. She appears well-developed and well-nourished.  HENT:  Head: Normocephalic and atraumatic.  Cardiovascular: Normal rate and regular rhythm. Exam reveals no gallop and no friction rub.  No murmur heard.  Pulmonary/Chest: Breath sounds normal. She has no wheezes. She  has no rales.  Neurological: She is alert and oriented to person, place, and time.  Skin: Skin is warm and dry.  Psychiatric: She has a normal mood and affect. Her behavior is normal.         Assessment & Plan:  Hyperlipidemia will check at CPE  GERD:  Continue PPI  Low back strain  Continue flexeril

## 2014-03-11 ENCOUNTER — Encounter: Payer: Self-pay | Admitting: Internal Medicine

## 2014-03-11 ENCOUNTER — Ambulatory Visit (INDEPENDENT_AMBULATORY_CARE_PROVIDER_SITE_OTHER): Payer: 59 | Admitting: Internal Medicine

## 2014-03-11 VITALS — BP 118/72 | HR 77 | Resp 16 | Ht 64.0 in | Wt 185.0 lb

## 2014-03-11 DIAGNOSIS — Z23 Encounter for immunization: Secondary | ICD-10-CM

## 2014-03-11 DIAGNOSIS — F4322 Adjustment disorder with anxiety: Secondary | ICD-10-CM

## 2014-03-11 DIAGNOSIS — I10 Essential (primary) hypertension: Secondary | ICD-10-CM

## 2014-03-11 DIAGNOSIS — S39012A Strain of muscle, fascia and tendon of lower back, initial encounter: Secondary | ICD-10-CM

## 2014-03-11 LAB — COMPREHENSIVE METABOLIC PANEL
ALBUMIN: 4.3 g/dL (ref 3.5–5.2)
ALK PHOS: 74 U/L (ref 39–117)
ALT: 24 U/L (ref 0–35)
AST: 21 U/L (ref 0–37)
BILIRUBIN TOTAL: 0.5 mg/dL (ref 0.2–1.2)
BUN: 11 mg/dL (ref 6–23)
CHLORIDE: 104 meq/L (ref 96–112)
CO2: 27 mEq/L (ref 19–32)
Calcium: 9.6 mg/dL (ref 8.4–10.5)
Creat: 0.85 mg/dL (ref 0.50–1.10)
GLUCOSE: 93 mg/dL (ref 70–99)
Potassium: 4.1 mEq/L (ref 3.5–5.3)
SODIUM: 140 meq/L (ref 135–145)
TOTAL PROTEIN: 6.6 g/dL (ref 6.0–8.3)

## 2014-03-11 MED ORDER — CYCLOBENZAPRINE HCL 10 MG PO TABS: ORAL_TABLET | ORAL | Status: DC

## 2014-03-11 MED ORDER — PANTOPRAZOLE SODIUM 20 MG PO TBEC: 20.0000 mg | DELAYED_RELEASE_TABLET | Freq: Every day | ORAL | Status: DC

## 2014-03-11 MED ORDER — ESCITALOPRAM OXALATE 5 MG PO TABS: ORAL_TABLET | ORAL | Status: DC

## 2014-03-11 MED ORDER — LISINOPRIL-HYDROCHLOROTHIAZIDE 10-12.5 MG PO TABS: 1.0000 | ORAL_TABLET | Freq: Every day | ORAL | Status: DC

## 2014-03-12 NOTE — Progress Notes (Signed)
Mailed a copy of labs to patient-eh 

## 2014-03-25 ENCOUNTER — Encounter: Payer: Self-pay | Admitting: *Deleted

## 2014-03-25 ENCOUNTER — Encounter: Payer: Self-pay | Admitting: Internal Medicine

## 2014-03-25 ENCOUNTER — Ambulatory Visit (INDEPENDENT_AMBULATORY_CARE_PROVIDER_SITE_OTHER): Payer: 59 | Admitting: Internal Medicine

## 2014-03-25 VITALS — BP 114/75 | HR 74 | Resp 16 | Ht 64.0 in | Wt 183.0 lb

## 2014-03-25 DIAGNOSIS — Z1151 Encounter for screening for human papillomavirus (HPV): Secondary | ICD-10-CM

## 2014-03-25 DIAGNOSIS — Z124 Encounter for screening for malignant neoplasm of cervix: Secondary | ICD-10-CM

## 2014-03-25 DIAGNOSIS — Z0001 Encounter for general adult medical examination with abnormal findings: Secondary | ICD-10-CM

## 2014-03-25 DIAGNOSIS — Z8541 Personal history of malignant neoplasm of cervix uteri: Secondary | ICD-10-CM

## 2014-03-25 DIAGNOSIS — Z0000 Encounter for general adult medical examination without abnormal findings: Secondary | ICD-10-CM

## 2014-03-25 DIAGNOSIS — Z1211 Encounter for screening for malignant neoplasm of colon: Secondary | ICD-10-CM

## 2014-03-25 DIAGNOSIS — I10 Essential (primary) hypertension: Secondary | ICD-10-CM

## 2014-03-25 DIAGNOSIS — K219 Gastro-esophageal reflux disease without esophagitis: Secondary | ICD-10-CM

## 2014-03-25 DIAGNOSIS — R319 Hematuria, unspecified: Secondary | ICD-10-CM

## 2014-03-25 DIAGNOSIS — F4322 Adjustment disorder with anxiety: Secondary | ICD-10-CM

## 2014-03-25 LAB — POCT URINALYSIS DIPSTICK
Bilirubin, UA: NEGATIVE
Glucose, UA: NEGATIVE
Ketones, UA: NEGATIVE
Leukocytes, UA: NEGATIVE
Nitrite, UA: NEGATIVE
Protein, UA: NEGATIVE
Spec Grav, UA: 1.01
Urobilinogen, UA: NEGATIVE
pH, UA: 6.5

## 2014-03-25 LAB — HEMOCCULT GUIAC POC 1CARD (OFFICE): Fecal Occult Blood, POC: NEGATIVE

## 2014-03-25 MED ORDER — LISINOPRIL-HYDROCHLOROTHIAZIDE 10-12.5 MG PO TABS: 1.0000 | ORAL_TABLET | Freq: Every day | ORAL | Status: DC
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
## 2014-03-26 LAB — URINALYSIS, MICROSCOPIC ONLY
BACTERIA UA: NONE SEEN
CRYSTALS: NONE SEEN
Casts: NONE SEEN

## 2014-03-26 LAB — URINE CULTURE
COLONY COUNT: NO GROWTH
Organism ID, Bacteria: NO GROWTH

## 2014-03-27 LAB — CYTOLOGY - PAP

## 2014-08-01 ENCOUNTER — Telehealth: Payer: Self-pay | Admitting: Internal Medicine

## 2014-08-01 MED ORDER — ESCITALOPRAM OXALATE 5 MG PO TABS: ORAL_TABLET | ORAL | Status: DC

## 2014-08-01 MED ORDER — LISINOPRIL-HYDROCHLOROTHIAZIDE 10-12.5 MG PO TABS: 1.0000 | ORAL_TABLET | Freq: Every day | ORAL | Status: DC

## 2014-08-01 NOTE — Telephone Encounter (Signed)
Caller name: Nekeya Relation to pt: self Call back number: [phone] Pharmacy: [facility]  Reason for call:   Patient has new patient appt scheduled for 09/16/14. Transfer from Dr. Coralyn Mark. Requesting refills lisinopril and lexapro

## 2014-08-01 NOTE — Telephone Encounter (Signed)
Rx faxed per Dr.Lowne.     KP

## 2014-09-12 ENCOUNTER — Encounter: Payer: Self-pay | Admitting: *Deleted

## 2014-09-12 ENCOUNTER — Telehealth: Payer: Self-pay | Admitting: *Deleted

## 2014-09-12 NOTE — Telephone Encounter (Signed)
Pre-Visit Call completed with patient and chart updated.   Pre-Visit Info documented in Specialty Comments under [employer].    

## 2014-09-16 ENCOUNTER — Encounter: Payer: Self-pay | Admitting: Family Medicine

## 2014-09-16 ENCOUNTER — Ambulatory Visit (INDEPENDENT_AMBULATORY_CARE_PROVIDER_SITE_OTHER): Payer: 59 | Admitting: Family Medicine

## 2014-09-16 VITALS — BP 124/70 | HR 74 | Temp 98.4°F | Resp 16 | Ht 64.0 in | Wt 184.0 lb

## 2014-09-16 DIAGNOSIS — I10 Essential (primary) hypertension: Secondary | ICD-10-CM

## 2014-09-16 DIAGNOSIS — F4322 Adjustment disorder with anxiety: Secondary | ICD-10-CM

## 2014-09-16 DIAGNOSIS — F419 Anxiety disorder, unspecified: Secondary | ICD-10-CM

## 2014-09-16 DIAGNOSIS — K219 Gastro-esophageal reflux disease without esophagitis: Secondary | ICD-10-CM | POA: Diagnosis not present

## 2014-09-16 DIAGNOSIS — J4520 Mild intermittent asthma, uncomplicated: Secondary | ICD-10-CM

## 2014-09-16 DIAGNOSIS — F4329 Adjustment disorder with other symptoms: Secondary | ICD-10-CM | POA: Diagnosis not present

## 2014-09-16 DIAGNOSIS — F32A Depression, unspecified: Secondary | ICD-10-CM

## 2014-09-16 DIAGNOSIS — F329 Major depressive disorder, single episode, unspecified: Secondary | ICD-10-CM

## 2014-09-16 DIAGNOSIS — J45990 Exercise induced bronchospasm: Secondary | ICD-10-CM

## 2014-09-16 MED ORDER — ESCITALOPRAM OXALATE 10 MG PO TABS: 10.0000 mg | ORAL_TABLET | Freq: Every day | ORAL | Status: DC

## 2014-09-16 MED ORDER — CYCLOBENZAPRINE HCL 10 MG PO TABS: ORAL_TABLET | ORAL | Status: DC

## 2014-09-16 MED ORDER — ALBUTEROL SULFATE HFA 108 (90 BASE) MCG/ACT IN AERS: 2.0000 | INHALATION_SPRAY | Freq: Four times a day (QID) | RESPIRATORY_TRACT | Status: AC | PRN

## 2014-09-16 MED ORDER — PANTOPRAZOLE SODIUM 20 MG PO TBEC: 20.0000 mg | DELAYED_RELEASE_TABLET | Freq: Every day | ORAL | Status: DC

## 2014-09-16 MED ORDER — BUPROPION HCL ER (XL) 150 MG PO TB24: ORAL_TABLET | ORAL | Status: DC

## 2014-09-16 MED ORDER — LISINOPRIL-HYDROCHLOROTHIAZIDE 10-12.5 MG PO TABS: 1.0000 | ORAL_TABLET | Freq: Every day | ORAL | Status: DC

## 2014-09-16 NOTE — Assessment & Plan Note (Addendum)
Stable-- con't lisinopril hct

## 2014-09-16 NOTE — Patient Instructions (Signed)
Menopause Menopause is the normal time of life when menstrual periods stop completely. Menopause is complete when you have missed 12 consecutive menstrual periods. It usually occurs between the ages of 48 years and 55 years. Very rarely does a woman develop menopause before the age of 40 years. At menopause, your ovaries stop producing the female hormones estrogen and progesterone. This can cause undesirable symptoms and also affect your health. Sometimes the symptoms may occur 4-5 years before the menopause begins. There is no relationship between menopause and:  Oral contraceptives.  Number of children you had.  Race.  The age your menstrual periods started (menarche). Heavy smokers and very thin women may develop menopause earlier in life. CAUSES  The ovaries stop producing the female hormones estrogen and progesterone.  Other causes include:  Surgery to remove both ovaries.  The ovaries stop functioning for no known reason.  Tumors of the pituitary gland in the brain.  Medical disease that affects the ovaries and hormone production.  Radiation treatment to the abdomen or pelvis.  Chemotherapy that affects the ovaries. SYMPTOMS   Hot flashes.  Night sweats.  Decrease in sex drive.  Vaginal dryness and thinning of the vagina causing painful intercourse.  Dryness of the skin and developing wrinkles.  Headaches.  Tiredness.  Irritability.  Memory problems.  Weight gain.  Bladder infections.  Hair growth of the face and chest.  Infertility. More serious symptoms include:  Loss of bone (osteoporosis) causing breaks (fractures).  Depression.  Hardening and narrowing of the arteries (atherosclerosis) causing heart attacks and strokes. DIAGNOSIS   When the menstrual periods have stopped for 12 straight months.  Physical exam.  Hormone studies of the blood. TREATMENT  There are many treatment choices and nearly as many questions about them. The  decisions to treat or not to treat menopausal changes is an individual choice made with your health care provider. Your health care provider can discuss the treatments with you. Together, you can decide which treatment will work best for you. Your treatment choices may include:   Hormone therapy (estrogen and progesterone).  Non-hormonal medicines.  Treating the individual symptoms with medicine (for example antidepressants for depression).  Herbal medicines that may help specific symptoms.  Counseling by a psychiatrist or psychologist.  Group therapy.  Lifestyle changes including:  Eating healthy.  Regular exercise.  Limiting caffeine and alcohol.  Stress management and meditation.  No treatment. HOME CARE INSTRUCTIONS   Take the medicine your health care provider gives you as directed.  Get plenty of sleep and rest.  Exercise regularly.  Eat a diet that contains calcium (good for the bones) and soy products (acts like estrogen hormone).  Avoid alcoholic beverages.  Do not smoke.  If you have hot flashes, dress in layers.  Take supplements, calcium, and vitamin D to strengthen bones.  You can use over-the-counter lubricants or moisturizers for vaginal dryness.  Group therapy is sometimes very helpful.  Acupuncture may be helpful in some cases. SEEK MEDICAL CARE IF:   You are not sure you are in menopause.  You are having menopausal symptoms and need advice and treatment.  You are still having menstrual periods after age 55 years.  You have pain with intercourse.  Menopause is complete (no menstrual period for 12 months) and you develop vaginal bleeding.  You need a referral to a specialist (gynecologist, psychiatrist, or psychologist) for treatment. SEEK IMMEDIATE MEDICAL CARE IF:   You have severe depression.  You have excessive vaginal bleeding.    You fell and think you have a broken bone.  You have pain when you urinate.  You develop leg or  chest pain.  You have a fast pounding heart beat (palpitations).  You have severe headaches.  You develop vision problems.  You feel a lump in your breast.  You have abdominal pain or severe indigestion. Document Released: 04/24/2003 Document Revised: 10/04/2012 Document Reviewed: 08/31/2012 Exit[employer] Patient Information 2015 [employer]. This information is not intended to replace advice given to you by your health care provider. Make sure you discuss any questions you have with your health care provider.  

## 2014-09-16 NOTE — Assessment & Plan Note (Signed)
con't lexapro--- pt stopped it cold [place] and had problem with inc hr She has been on it 1 day  Afraid of weight gain ----she has gained quite a lot since last year Try to add wellbutrin xl 150 mg qd for 1 week then 2 po qd We discussed SE-- pt understands

## 2014-09-16 NOTE — Progress Notes (Signed)
Patient ID: Lacey Johns, female    DOB: 14-Dec-1955  Age: 59 y.o. MRN: [id number]    Subjective:  Subjective HPI Lacey Johns presents to establish and get refills on meds.  No new complaints.    Review of Systems  Constitutional: Negative for diaphoresis, appetite change, fatigue and unexpected weight change.  Eyes: Negative for pain, redness and visual disturbance.  Respiratory: Negative for cough, chest tightness, shortness of breath and wheezing.   Cardiovascular: Negative for chest pain, palpitations and leg swelling.  Endocrine: Negative for cold intolerance, heat intolerance, polydipsia, polyphagia and polyuria.  Genitourinary: Negative for dysuria, frequency and difficulty urinating.  Neurological: Negative for dizziness, light-headedness, numbness and headaches.  Psychiatric/Behavioral: Negative for behavioral problems. The patient is nervous/anxious.     History Past Medical History  Diagnosis Date  . Asthma   . Diabetes mellitus without complication   . Hypertension   . Carpal tunnel syndrome, bilateral   . Ankle fracture, right   . Abnormal mammogram of right breast   . Abnormal Pap smear of cervix   . GERD (gastroesophageal reflux disease)     She has past surgical history that includes Tonsillectomy; Ovarian cyst removal; Cervical cone biopsy; Wrist surgery; Nasal septum surgery; and Neck surgery.   Her family history includes ALS in her maternal aunt; Breast cancer in her maternal aunt; Diabetes in her father; Hyperlipidemia in her father; Hypertension in her father and mother; Parkinson's disease in her maternal aunt.She reports that she has never smoked. She has never used smokeless tobacco. She reports that she does not drink alcohol or use illicit drugs.  Current Outpatient Prescriptions on File Prior to Visit  Medication Sig Dispense Refill  . Calcium Citrate-Vitamin D (CITRACAL/VITAMIN D) 250-200 MG-UNIT TABS Take 2 tablets by mouth daily.    Marland Kitchen  escitalopram (LEXAPRO) 5 MG tablet Take two daily 60 tablet 2  . valACYclovir (VALTREX) 500 MG tablet Take 500 mg by mouth 2 (two) times daily.     No current facility-administered medications on file prior to visit.     Objective:  Objective Physical Exam  Constitutional: She is oriented to person, place, and time. She appears well-developed and well-nourished.  HENT:  Head: Normocephalic and atraumatic.  Eyes: Conjunctivae and EOM are normal.  Neck: Normal range of motion. Neck supple. No JVD present. Carotid bruit is not present. No thyromegaly present.  Cardiovascular: Normal rate, regular rhythm and normal heart sounds.   No murmur heard. Pulmonary/Chest: Effort normal and breath sounds normal. No respiratory distress. She has no wheezes. She has no rales. She exhibits no tenderness.  Musculoskeletal: She exhibits no edema.  Neurological: She is alert and oriented to person, place, and time.  Psychiatric: Her mood appears anxious. She does not exhibit a depressed mood. She expresses no homicidal and no suicidal ideation. She expresses no suicidal plans and no homicidal plans.   BP 124/70 mmHg  Pulse 74  Temp(Src) 98.4 F (36.9 C) (Oral)  Resp 16  Ht 5\' 4"  (1.626 m)  Wt 184 lb (83.462 kg)  BMI 31.57 kg/m2  SpO2 99% Wt Readings from Last 3 Encounters:  09/16/14 184 lb (83.462 kg)  03/25/14 183 lb (83.008 kg)  03/11/14 185 lb (83.915 kg)     Lab Results  Component Value Date   WBC 6.1 08/15/2013   HGB 14.7 08/15/2013   HCT 43.0 08/15/2013   PLT 347 08/15/2013   GLUCOSE 93 03/11/2014   CHOL 230* 08/15/2013   TRIG 169* 08/15/2013  HDL 60 08/15/2013   LDLCALC 136* 08/15/2013   ALT 24 03/11/2014   AST 21 03/11/2014   NA 140 03/11/2014   K 4.1 03/11/2014   CL 104 03/11/2014   CREATININE 0.85 03/11/2014   BUN 11 03/11/2014   CO2 27 03/11/2014   TSH 2.501 08/15/2013    Mm Screening Breast Tomo Bilateral  08/28/2013   CLINICAL DATA:  Screening.  EXAM: DIGITAL  SCREENING BILATERAL MAMMOGRAM WITH 3D TOMO WITH CAD  COMPARISON:  Previous exam(s).  ACR Breast Density Category b: There are scattered areas of fibroglandular density.  FINDINGS: There are no findings suspicious for malignancy. Images were processed with CAD.  IMPRESSION: No mammographic evidence of malignancy. A result letter of this screening mammogram will be mailed directly to the patient.  RECOMMENDATION: Screening mammogram in one year. (Code:SM-B-01Y)  BI-RADS CATEGORY  1: Negative.   Electronically Signed   By: Conchita Paris M.D.   On: 08/28/2013 10:40     Assessment & Plan:  Plan I have changed Lacey Johns's albuterol. I am also having her start on escitalopram and buPROPion. Additionally, I am having her maintain her Calcium Citrate-Vitamin D, valACYclovir, escitalopram, cyclobenzaprine, pantoprazole, lisinopril-hydrochlorothiazide, Glucosamine, NADH-ASCORBIC ACID-SOD BICARB PO, Coenzyme Q10 (COQ-10 PO), Ascorbic Acid (VITAMIN C PO), and (VITAMIN D, ERGOCALCIFEROL, PO).  Meds ordered this encounter  Medications  . cyclobenzaprine (FLEXERIL) 10 MG tablet    Sig: Take 1/2 or one whole tablet bid for muscle spasms    Dispense:  30 tablet    Refill:  0  . albuterol (PROVENTIL HFA;VENTOLIN HFA) 108 (90 BASE) MCG/ACT inhaler    Sig: Inhale 2 puffs into the lungs every 6 (six) hours as needed for wheezing or shortness of breath.    Dispense:  1 Inhaler    Refill:  3  . pantoprazole (PROTONIX) 20 MG tablet    Sig: Take 1 tablet (20 mg total) by mouth daily.    Dispense:  90 tablet    Refill:  1  . lisinopril-hydrochlorothiazide (PRINZIDE,ZESTORETIC) 10-12.5 MG per tablet    Sig: Take 1 tablet by mouth daily.    Dispense:  90 tablet    Refill:  0  . Glucosamine 500 MG CAPS    Sig: Take by mouth.  Marland Kitchen NADH-ASCORBIC ACID-SOD BICARB PO    Sig: Take by mouth.  . Coenzyme Q10 (COQ-10 PO)    Sig: Take by mouth.  . Ascorbic Acid (VITAMIN C PO)    Sig: Take by mouth.  Marland Kitchen VITAMIN D,  ERGOCALCIFEROL, PO    Sig: Take by mouth.  . escitalopram (LEXAPRO) 10 MG tablet    Sig: Take 1 tablet (10 mg total) by mouth daily.    Dispense:  90 tablet    Refill:  3  . buPROPion (WELLBUTRIN XL) 150 MG 24 hr tablet    Sig: 1 po qam x 1 week then 2 po qam    Dispense:  60 tablet    Refill:  5    Problem List Items Addressed This Visit    HTN (hypertension)    Stable-- con't lisinopril hct      Relevant Medications   lisinopril-hydrochlorothiazide (PRINZIDE,ZESTORETIC) 10-12.5 MG per tablet   Exercise-induced asthma    Refill inhaler stable      Relevant Medications   albuterol (PROVENTIL HFA;VENTOLIN HFA) 108 (90 BASE) MCG/ACT inhaler   Adjustment disorder with anxious mood    con't lexapro--- pt stopped it cold [place] and had problem with inc hr She has  been on it 1 day  Afraid of weight gain ----she has gained quite a lot since last year Try to add wellbutrin xl 150 mg qd for 1 week then 2 po qd We discussed SE-- pt understands       Other Visit Diagnoses    Anxiety disorder, unspecified anxiety disorder type    -  Primary    Relevant Medications    cyclobenzaprine (FLEXERIL) 10 MG tablet    Adjustment disorder with other symptom        Relevant Medications    escitalopram (LEXAPRO) 10 MG tablet    Gastroesophageal reflux disease, esophagitis presence not specified        Relevant Medications    pantoprazole (PROTONIX) 20 MG tablet    Other Relevant Orders    Ambulatory referral to Gastroenterology    Asthma, chronic, mild intermittent, uncomplicated        Relevant Medications    albuterol (PROVENTIL HFA;VENTOLIN HFA) 108 (90 BASE) MCG/ACT inhaler    Depression        Relevant Medications    escitalopram (LEXAPRO) 10 MG tablet    buPROPion (WELLBUTRIN XL) 150 MG 24 hr tablet       Follow-up: Return in about 6 months (around 03/19/2015), or if symptoms worsen or fail to improve, for annual exam, fasting.  Garnet Koyanagi, DO

## 2014-09-16 NOTE — Assessment & Plan Note (Signed)
Refill inhaler stable

## 2014-09-16 NOTE — Progress Notes (Signed)
Pre visit review using our clinic review tool, if applicable. No additional management support is needed unless otherwise documented below in the visit note. 

## 2015-04-21 ENCOUNTER — Other Ambulatory Visit: Payer: Self-pay | Admitting: Family Medicine

## 2015-06-05 ENCOUNTER — Other Ambulatory Visit: Payer: Self-pay

## 2015-06-05 DIAGNOSIS — I10 Essential (primary) hypertension: Secondary | ICD-10-CM

## 2015-06-05 MED ORDER — LISINOPRIL-HYDROCHLOROTHIAZIDE 10-12.5 MG PO TABS: 1.0000 | ORAL_TABLET | Freq: Every day | ORAL | Status: DC

## 2015-06-05 MED ORDER — BUPROPION HCL ER (XL) 150 MG PO TB24
300.0000 mg | ORAL_TABLET | Freq: Every day | ORAL | Status: DC
Start: 2015-06-05 — End: 2015-07-08

## 2015-07-08 ENCOUNTER — Encounter: Payer: Self-pay | Admitting: Family Medicine

## 2015-07-08 ENCOUNTER — Ambulatory Visit (INDEPENDENT_AMBULATORY_CARE_PROVIDER_SITE_OTHER): Payer: 59 | Admitting: Family Medicine

## 2015-07-08 VITALS — BP 128/80 | HR 79 | Temp 98.0°F | Ht 64.0 in | Wt 190.0 lb

## 2015-07-08 DIAGNOSIS — B001 Herpesviral vesicular dermatitis: Secondary | ICD-10-CM

## 2015-07-08 DIAGNOSIS — I10 Essential (primary) hypertension: Secondary | ICD-10-CM

## 2015-07-08 DIAGNOSIS — T148 Other injury of unspecified body region: Secondary | ICD-10-CM

## 2015-07-08 DIAGNOSIS — F419 Anxiety disorder, unspecified: Secondary | ICD-10-CM | POA: Diagnosis not present

## 2015-07-08 DIAGNOSIS — F4322 Adjustment disorder with anxiety: Secondary | ICD-10-CM

## 2015-07-08 DIAGNOSIS — K219 Gastro-esophageal reflux disease without esophagitis: Secondary | ICD-10-CM

## 2015-07-08 DIAGNOSIS — T148XXA Other injury of unspecified body region, initial encounter: Secondary | ICD-10-CM

## 2015-07-08 MED ORDER — VALACYCLOVIR HCL 500 MG PO TABS: 500.0000 mg | ORAL_TABLET | Freq: Two times a day (BID) | ORAL | Status: DC

## 2015-07-08 MED ORDER — CYCLOBENZAPRINE HCL 10 MG PO TABS: ORAL_TABLET | ORAL | Status: DC

## 2015-07-08 MED ORDER — LISINOPRIL-HYDROCHLOROTHIAZIDE 10-12.5 MG PO TABS
1.0000 | ORAL_TABLET | Freq: Every day | ORAL | Status: DC
Start: 2015-07-08 — End: 2015-10-06

## 2015-07-08 MED ORDER — ESCITALOPRAM OXALATE 20 MG PO TABS: 20.0000 mg | ORAL_TABLET | Freq: Every day | ORAL | Status: DC

## 2015-07-08 MED ORDER — BUPROPION HCL ER (XL) 150 MG PO TB24: ORAL_TABLET | ORAL | Status: DC

## 2015-07-08 MED ORDER — PANTOPRAZOLE SODIUM 40 MG PO TBEC: 40.0000 mg | DELAYED_RELEASE_TABLET | Freq: Two times a day (BID) | ORAL | Status: DC

## 2015-07-08 NOTE — Progress Notes (Signed)
Pre visit review using our clinic review tool, if applicable. No additional management support is needed unless otherwise documented below in the visit note. 

## 2015-07-08 NOTE — Patient Instructions (Signed)
Generalized Anxiety Disorder Generalized anxiety disorder (GAD) is a mental disorder. It interferes with life functions, including relationships, work, and school. GAD is different from normal anxiety, which everyone experiences at some point in their lives in response to specific life events and activities. Normal anxiety actually helps us prepare for and get through these life events and activities. Normal anxiety goes away after the event or activity is over.  GAD causes anxiety that is not necessarily related to specific events or activities. It also causes excess anxiety in proportion to specific events or activities. The anxiety associated with GAD is also difficult to control. GAD can vary from mild to severe. People with severe GAD can have intense waves of anxiety with physical symptoms (panic attacks).  SYMPTOMS The anxiety and worry associated with GAD are difficult to control. This anxiety and worry are related to many life events and activities and also occur more days than not for 6 months or longer. People with GAD also have three or more of the following symptoms (one or more in children):  Restlessness.   Fatigue.  Difficulty concentrating.   Irritability.  Muscle tension.  Difficulty sleeping or unsatisfying sleep. DIAGNOSIS GAD is diagnosed through an assessment by your health care provider. Your health care provider will ask you questions aboutyour mood,physical symptoms, and events in your life. Your health care provider may ask you about your medical history and use of alcohol or drugs, including prescription medicines. Your health care provider may also do a physical exam and blood tests. Certain medical conditions and the use of certain substances can cause symptoms similar to those associated with GAD. Your health care provider may refer you to a mental health specialist for further evaluation. TREATMENT The following therapies are usually used to treat GAD:    Medication. Antidepressant medication usually is prescribed for long-term daily control. Antianxiety medicines may be added in severe cases, especially when panic attacks occur.   Talk therapy (psychotherapy). Certain types of talk therapy can be helpful in treating GAD by providing support, education, and guidance. A form of talk therapy called cognitive behavioral therapy can teach you healthy ways to think about and react to daily life events and activities.  Stress managementtechniques. These include yoga, meditation, and exercise and can be very helpful when they are practiced regularly. A mental health specialist can help determine which treatment is best for you. Some people see improvement with one therapy. However, other people require a combination of therapies.   This information is not intended to replace advice given to you by your health care provider. Make sure you discuss any questions you have with your health care provider.   Document Released: 05/29/2012 Document Revised: 02/22/2014 Document Reviewed: 05/29/2012 Elsevier Interactive Patient Education 2016 [employer]  

## 2015-07-08 NOTE — Progress Notes (Signed)
Patient ID: Lacey Johns, female    DOB: 1955-03-11  Age: 60 y.o. MRN: [id number]    Subjective:  Subjective HPI Lacey Johns presents for f/u anxiety-- it has worsened.  She also c/o bruising easily. She is taking a lot of motrin lately for aches and pains and she has been a lot more tense lately.    Review of Systems  Constitutional: Negative for diaphoresis, appetite change, fatigue and unexpected weight change.  Eyes: Negative for pain, redness and visual disturbance.  Respiratory: Negative for cough, chest tightness, shortness of breath and wheezing.   Cardiovascular: Negative for chest pain, palpitations and leg swelling.  Endocrine: Negative for cold intolerance, heat intolerance, polydipsia, polyphagia and polyuria.  Genitourinary: Negative for dysuria, frequency and difficulty urinating.  Neurological: Negative for dizziness, light-headedness, numbness and headaches.    History Past Medical History  Diagnosis Date  . Asthma   . Diabetes mellitus without complication ([place])   . Hypertension   . Carpal tunnel syndrome, bilateral   . Ankle fracture, right   . Abnormal mammogram of right breast   . Abnormal Pap smear of cervix   . GERD (gastroesophageal reflux disease)     She has past surgical history that includes Tonsillectomy; Ovarian cyst removal; Cervical cone biopsy; Wrist surgery; Nasal septum surgery; and Neck surgery.   Her family history includes ALS in her maternal aunt; Breast cancer in her maternal aunt; Diabetes in her father; Hyperlipidemia in her father; Hypertension in her father and mother; Parkinson's disease in her maternal aunt.She reports that she has never smoked. She has never used smokeless tobacco. She reports that she does not drink alcohol or use illicit drugs.  Current Outpatient Prescriptions on File Prior to Visit  Medication Sig Dispense Refill  . albuterol (PROVENTIL HFA;VENTOLIN HFA) 108 (90 BASE) MCG/ACT inhaler Inhale 2 puffs into the  lungs every 6 (six) hours as needed for wheezing or shortness of breath. 1 Inhaler 3  . Ascorbic Acid (VITAMIN C PO) Take by mouth.    . Calcium Citrate-Vitamin D (CITRACAL/VITAMIN D) 250-200 MG-UNIT TABS Take 2 tablets by mouth daily.    . Coenzyme Q10 (COQ-10 PO) Take by mouth.    . Glucosamine 500 MG CAPS Take by mouth.    Marland Kitchen NADH-ASCORBIC ACID-SOD BICARB PO Take by mouth.    Marland Kitchen VITAMIN D, ERGOCALCIFEROL, PO Take by mouth.     No current facility-administered medications on file prior to visit.     Objective:  Objective Physical Exam  Constitutional: She is oriented to person, place, and time. She appears well-developed and well-nourished.  HENT:  Head: Normocephalic and atraumatic.  Eyes: Conjunctivae and EOM are normal.  Neck: Normal range of motion. Neck supple. No JVD present. Carotid bruit is not present. No thyromegaly present.  Cardiovascular: Normal rate, regular rhythm and normal heart sounds.   No murmur heard. Pulmonary/Chest: Effort normal and breath sounds normal. No respiratory distress. She has no wheezes. She has no rales. She exhibits no tenderness.  Musculoskeletal: She exhibits no edema.  Neurological: She is alert and oriented to person, place, and time.  Psychiatric: She has a normal mood and affect.  Nursing note and vitals reviewed.  BP 128/80 mmHg  Pulse 79  Temp(Src) 98 F (36.7 C) (Oral)  Ht 5\' 4"  (1.626 m)  Wt 190 lb (86.183 kg)  BMI 32.60 kg/m2  SpO2 97% Wt Readings from Last 3 Encounters:  07/08/15 190 lb (86.183 kg)  09/16/14 184 lb (83.462 kg)  03/25/14 183  lb (83.008 kg)     Lab Results  Component Value Date   WBC 6.1 08/15/2013   HGB 14.7 08/15/2013   HCT 43.0 08/15/2013   PLT 347 08/15/2013   GLUCOSE 93 03/11/2014   CHOL 230* 08/15/2013   TRIG 169* 08/15/2013   HDL 60 08/15/2013   LDLCALC 136* 08/15/2013   ALT 24 03/11/2014   AST 21 03/11/2014   NA 140 03/11/2014   K 4.1 03/11/2014   CL 104 03/11/2014   CREATININE 0.85  03/11/2014   BUN 11 03/11/2014   CO2 27 03/11/2014   TSH 2.501 08/15/2013    Mm Screening Breast Tomo Bilateral  08/28/2013  CLINICAL DATA:  Screening. EXAM: DIGITAL SCREENING BILATERAL MAMMOGRAM WITH 3D TOMO WITH CAD COMPARISON:  Previous exam(s). ACR Breast Density Category b: There are scattered areas of fibroglandular density. FINDINGS: There are no findings suspicious for malignancy. Images were processed with CAD. IMPRESSION: No mammographic evidence of malignancy. A result letter of this screening mammogram will be mailed directly to the patient. RECOMMENDATION: Screening mammogram in one year. (Code:SM-B-01Y) BI-RADS CATEGORY  1: Negative. Electronically Signed   By: Conchita Paris M.D.   On: 08/28/2013 10:40     Assessment & Plan:  Plan I have discontinued Ms. Vinciguerra's escitalopram, pantoprazole, and escitalopram. I have also changed her pantoprazole, valACYclovir, and buPROPion. Additionally, I am having her start on escitalopram. Lastly, I am having her maintain her Calcium Citrate-Vitamin D, albuterol, Glucosamine, NADH-ASCORBIC ACID-SOD BICARB PO, Coenzyme Q10 (COQ-10 PO), Ascorbic Acid (VITAMIN C PO), (VITAMIN D, ERGOCALCIFEROL, PO), cyclobenzaprine, and lisinopril-hydrochlorothiazide.  Meds ordered this encounter  Medications  . DISCONTD: pantoprazole (PROTONIX) 40 MG tablet    Sig: Take 1 tablet by mouth 2 (two) times daily.  Marland Kitchen escitalopram (LEXAPRO) 20 MG tablet    Sig: Take 1 tablet (20 mg total) by mouth daily.    Dispense:  30 tablet    Refill:  5  . cyclobenzaprine (FLEXERIL) 10 MG tablet    Sig: Take 1/2 or one whole tablet bid for muscle spasms    Dispense:  30 tablet    Refill:  0  . lisinopril-hydrochlorothiazide (PRINZIDE,ZESTORETIC) 10-12.5 MG tablet    Sig: Take 1 tablet by mouth daily.    Dispense:  90 tablet    Refill:  3  . pantoprazole (PROTONIX) 40 MG tablet    Sig: Take 1 tablet (40 mg total) by mouth 2 (two) times daily.    Dispense:  90 tablet     Refill:  3  . valACYclovir (VALTREX) 500 MG tablet    Sig: Take 1 tablet (500 mg total) by mouth 2 (two) times daily.    Dispense:  20 tablet    Refill:  5  . buPROPion (WELLBUTRIN XL) 150 MG 24 hr tablet    Sig: 1 po qd    Dispense:  30 tablet    Refill:  2    Requested drug refills are authorized, however, the patient needs further evaluation and/or laboratory testing before further refills are given. Ask her to make an appointment for this.    Problem List Items Addressed This Visit      Unprioritized   Adjustment disorder with anxious mood    Increase lexapro to 20 mg daily      GERD (gastroesophageal reflux disease)   Relevant Medications   pantoprazole (PROTONIX) 40 MG tablet   HTN (hypertension)   Relevant Medications   lisinopril-hydrochlorothiazide (PRINZIDE,ZESTORETIC) 10-12.5 MG tablet   Other Relevant Orders  Comprehensive metabolic panel   CBC with Differential/Platelet   Lipid panel   POCT urinalysis dipstick    Other Visit Diagnoses    Anxiety disorder, unspecified anxiety disorder type    -  Primary    Relevant Medications    escitalopram (LEXAPRO) 20 MG tablet    cyclobenzaprine (FLEXERIL) 10 MG tablet    buPROPion (WELLBUTRIN XL) 150 MG 24 hr tablet    Cold sore        Relevant Medications    valACYclovir (VALTREX) 500 MG tablet    Bruising        Relevant Orders    Comprehensive metabolic panel    CBC with Differential/Platelet    Lipid panel    POCT urinalysis dipstick       Follow-up: Return in about 3 months (around 10/08/2015), or if symptoms worsen or fail to improve, for annual exam, fasting.  Ann Held, DO

## 2015-07-08 NOTE — Assessment & Plan Note (Signed)
Increase lexapro to 20 mg daily.

## 2015-07-11 ENCOUNTER — Other Ambulatory Visit (INDEPENDENT_AMBULATORY_CARE_PROVIDER_SITE_OTHER): Payer: 59

## 2015-07-11 DIAGNOSIS — I10 Essential (primary) hypertension: Secondary | ICD-10-CM

## 2015-07-11 DIAGNOSIS — R319 Hematuria, unspecified: Secondary | ICD-10-CM

## 2015-07-11 DIAGNOSIS — T148 Other injury of unspecified body region: Secondary | ICD-10-CM | POA: Diagnosis not present

## 2015-07-11 DIAGNOSIS — T148XXA Other injury of unspecified body region, initial encounter: Secondary | ICD-10-CM

## 2015-07-11 LAB — COMPREHENSIVE METABOLIC PANEL
ALBUMIN: 4.2 g/dL (ref 3.5–5.2)
ALT: 35 U/L (ref 0–35)
AST: 23 U/L (ref 0–37)
Alkaline Phosphatase: 99 U/L (ref 39–117)
BILIRUBIN TOTAL: 0.5 mg/dL (ref 0.2–1.2)
BUN: 18 mg/dL (ref 6–23)
CALCIUM: 9.5 mg/dL (ref 8.4–10.5)
CO2: 29 meq/L (ref 19–32)
CREATININE: 0.84 mg/dL (ref 0.40–1.20)
Chloride: 103 mEq/L (ref 96–112)
GFR: 73.91 mL/min (ref >60.00)
Glucose, Bld: 110 mg/dL — ABNORMAL HIGH (ref 70–99)
Potassium: 4.1 mEq/L (ref 3.5–5.1)
SODIUM: 139 meq/L (ref 135–145)
Total Protein: 6.6 g/dL (ref 6.0–8.3)

## 2015-07-11 LAB — POCT URINALYSIS DIPSTICK
BILIRUBIN UA: NEGATIVE
GLUCOSE UA: NEGATIVE
Ketones, UA: NEGATIVE
LEUKOCYTES UA: NEGATIVE
NITRITE UA: NEGATIVE
Protein, UA: NEGATIVE
Spec Grav, UA: >=1.03
Urobilinogen, UA: 0.2
pH, UA: 6

## 2015-07-11 LAB — CBC WITH DIFFERENTIAL/PLATELET
BASOS ABS: 0 10*3/uL (ref 0.0–0.1)
Basophils Relative: 0.6 % (ref 0.0–3.0)
EOS ABS: 0.2 10*3/uL (ref 0.0–0.7)
Eosinophils Relative: 3.8 % (ref 0.0–5.0)
HEMATOCRIT: 41.1 % (ref 36.0–46.0)
Hemoglobin: 13.5 g/dL (ref 12.0–15.0)
LYMPHS PCT: 40.1 % (ref 12.0–46.0)
Lymphs Abs: 2.5 10*3/uL (ref 0.7–4.0)
MCHC: 32.9 g/dL (ref 30.0–36.0)
MCV: 88.8 fl (ref 78.0–100.0)
MONO ABS: 0.5 10*3/uL (ref 0.1–1.0)
Monocytes Relative: 7.6 % (ref 3.0–12.0)
NEUTROS ABS: 3 10*3/uL (ref 1.4–7.7)
NEUTROS PCT: 47.9 % (ref 43.0–77.0)
PLATELETS: 371 10*3/uL (ref 150.0–400.0)
RBC: 4.62 Mil/uL (ref 3.87–5.11)
RDW: 15.1 % (ref 11.5–15.5)
WBC: 6.2 10*3/uL (ref 4.0–10.5)

## 2015-07-11 LAB — LIPID PANEL
CHOL/HDL RATIO: 5
CHOLESTEROL: 235 mg/dL — AB (ref 0–200)
HDL: 48.2 mg/dL (ref >39.00)
LDL Cholesterol: 171 mg/dL — ABNORMAL HIGH (ref 0–99)
NonHDL: 186.97
TRIGLYCERIDES: 80 mg/dL (ref 0.0–149.0)
VLDL: 16 mg/dL (ref 0.0–40.0)

## 2015-07-11 NOTE — Addendum Note (Signed)
Addended by: Caffie Pinto on: 07/11/2015 11:51 AM   Modules accepted: Orders

## 2015-07-13 LAB — URINE CULTURE: Colony Count: 50000

## 2015-07-15 NOTE — Addendum Note (Signed)
Addended byDamita Dunnings D on: 07/15/2015 08:51 AM   Modules accepted: Orders

## 2015-10-06 ENCOUNTER — Ambulatory Visit (INDEPENDENT_AMBULATORY_CARE_PROVIDER_SITE_OTHER): Payer: 59 | Admitting: Family Medicine

## 2015-10-06 ENCOUNTER — Encounter: Payer: Self-pay | Admitting: Family Medicine

## 2015-10-06 VITALS — BP 118/78 | HR 76 | Temp 98.6°F | Wt 197.4 lb

## 2015-10-06 DIAGNOSIS — I10 Essential (primary) hypertension: Secondary | ICD-10-CM

## 2015-10-06 DIAGNOSIS — K219 Gastro-esophageal reflux disease without esophagitis: Secondary | ICD-10-CM | POA: Diagnosis not present

## 2015-10-06 DIAGNOSIS — E785 Hyperlipidemia, unspecified: Secondary | ICD-10-CM | POA: Diagnosis not present

## 2015-10-06 DIAGNOSIS — F411 Generalized anxiety disorder: Secondary | ICD-10-CM

## 2015-10-06 DIAGNOSIS — K589 Irritable bowel syndrome without diarrhea: Secondary | ICD-10-CM | POA: Diagnosis not present

## 2015-10-06 DIAGNOSIS — F419 Anxiety disorder, unspecified: Secondary | ICD-10-CM

## 2015-10-06 LAB — COMPREHENSIVE METABOLIC PANEL
ALBUMIN: 4.3 g/dL (ref 3.5–5.2)
ALT: 29 U/L (ref 0–35)
AST: 22 U/L (ref 0–37)
Alkaline Phosphatase: 126 U/L — ABNORMAL HIGH (ref 39–117)
BUN: 12 mg/dL (ref 6–23)
CHLORIDE: 101 meq/L (ref 96–112)
CO2: 27 meq/L (ref 19–32)
Calcium: 9.2 mg/dL (ref 8.4–10.5)
Creatinine, Ser: 0.77 mg/dL (ref 0.40–1.20)
GFR: 81.65 mL/min (ref >60.00)
Glucose, Bld: 110 mg/dL — ABNORMAL HIGH (ref 70–99)
POTASSIUM: 3.8 meq/L (ref 3.5–5.1)
SODIUM: 136 meq/L (ref 135–145)
Total Bilirubin: 0.5 mg/dL (ref 0.2–1.2)
Total Protein: 6.8 g/dL (ref 6.0–8.3)

## 2015-10-06 LAB — THYROID PANEL WITH TSH
Free Thyroxine Index: 2.1 (ref 1.4–3.8)
T3 Uptake: 30 % (ref 22–35)
T4 TOTAL: 7 ug/dL (ref 4.5–12.0)
TSH: 1.88 m[IU]/L

## 2015-10-06 LAB — CBC WITH DIFFERENTIAL/PLATELET
BASOS PCT: 0.5 % (ref 0.0–3.0)
Basophils Absolute: 0 10*3/uL (ref 0.0–0.1)
EOS ABS: 0.2 10*3/uL (ref 0.0–0.7)
EOS PCT: 3 % (ref 0.0–5.0)
HCT: 39.9 % (ref 36.0–46.0)
HEMOGLOBIN: 13.5 g/dL (ref 12.0–15.0)
LYMPHS ABS: 2.1 10*3/uL (ref 0.7–4.0)
Lymphocytes Relative: 35.7 % (ref 12.0–46.0)
MCHC: 33.8 g/dL (ref 30.0–36.0)
MCV: 87.8 fl (ref 78.0–100.0)
MONO ABS: 0.4 10*3/uL (ref 0.1–1.0)
Monocytes Relative: 6.9 % (ref 3.0–12.0)
NEUTROS ABS: 3.1 10*3/uL (ref 1.4–7.7)
NEUTROS PCT: 53.9 % (ref 43.0–77.0)
PLATELETS: 328 10*3/uL (ref 150.0–400.0)
RBC: 4.55 Mil/uL (ref 3.87–5.11)
RDW: 14.6 % (ref 11.5–15.5)
WBC: 5.8 10*3/uL (ref 4.0–10.5)

## 2015-10-06 LAB — LIPID PANEL
CHOLESTEROL: 221 mg/dL — AB (ref 0–200)
HDL: 60.1 mg/dL (ref >39.00)
LDL CALC: 142 mg/dL — AB (ref 0–99)
NonHDL: 161.25
TRIGLYCERIDES: 95 mg/dL (ref 0.0–149.0)
Total CHOL/HDL Ratio: 4
VLDL: 19 mg/dL (ref 0.0–40.0)

## 2015-10-06 LAB — H. PYLORI ANTIBODY, IGG: H PYLORI IGG: NEGATIVE

## 2015-10-06 MED ORDER — LISINOPRIL-HYDROCHLOROTHIAZIDE 10-12.5 MG PO TABS: 1.0000 | ORAL_TABLET | Freq: Every day | ORAL | 3 refills | Status: DC

## 2015-10-06 MED ORDER — BUPROPION HCL ER (XL) 150 MG PO TB24: ORAL_TABLET | ORAL | 3 refills | Status: DC

## 2015-10-06 MED ORDER — CYCLOBENZAPRINE HCL 10 MG PO TABS: ORAL_TABLET | ORAL | 0 refills | Status: DC

## 2015-10-06 MED ORDER — HYOSCYAMINE SULFATE 0.125 MG SL SUBL: 0.1250 mg | SUBLINGUAL_TABLET | SUBLINGUAL | 0 refills | Status: DC | PRN

## 2015-10-06 MED ORDER — ESCITALOPRAM OXALATE 20 MG PO TABS: 20.0000 mg | ORAL_TABLET | Freq: Every day | ORAL | 5 refills | Status: DC

## 2015-10-06 MED ORDER — PANTOPRAZOLE SODIUM 40 MG PO TBEC: 40.0000 mg | DELAYED_RELEASE_TABLET | Freq: Two times a day (BID) | ORAL | 3 refills | Status: DC

## 2015-10-06 NOTE — Progress Notes (Signed)
Patient ID: Lacey Johns, female    DOB: 09/24/1955  Age: 60 y.o. MRN: [id number]    Subjective:  Subjective  HPI Lacey Johns presents for f/u anxiety and bp but is c/o ibs and gerd that is worsening.  She has loose stools that she is unable to control.    Review of Systems  Constitutional: Positive for unexpected weight change. Negative for appetite change, diaphoresis and fatigue.  Eyes: Negative for pain, redness and visual disturbance.  Respiratory: Negative for cough, chest tightness, shortness of breath and wheezing.   Cardiovascular: Negative for chest pain, palpitations and leg swelling.  Gastrointestinal: Positive for abdominal pain and diarrhea.  Endocrine: Negative for cold intolerance, heat intolerance, polydipsia, polyphagia and polyuria.  Genitourinary: Negative for difficulty urinating, dysuria and frequency.  Neurological: Negative for dizziness, light-headedness, numbness and headaches.    History Past Medical History:  Diagnosis Date  . Abnormal mammogram of right breast   . Abnormal Pap smear of cervix   . Ankle fracture, right   . Asthma   . Carpal tunnel syndrome, bilateral   . Diabetes mellitus without complication ([place])   . GERD (gastroesophageal reflux disease)   . Hypertension     She has a past surgical history that includes Tonsillectomy; Ovarian cyst removal; Cervical cone biopsy; Wrist surgery; Nasal septum surgery; and Neck surgery.   Her family history includes ALS in her maternal aunt; Breast cancer in her maternal aunt; Diabetes in her father; Hyperlipidemia in her father; Hypertension in her father and mother; Parkinson's disease in her maternal aunt.She reports that she has never smoked. She has never used smokeless tobacco. She reports that she does not drink alcohol or use drugs.  Current Outpatient Prescriptions on File Prior to Visit  Medication Sig Dispense Refill  . albuterol (PROVENTIL HFA;VENTOLIN HFA) 108 (90 BASE) MCG/ACT inhaler  Inhale 2 puffs into the lungs every 6 (six) hours as needed for wheezing or shortness of breath. 1 Inhaler 3  . Ascorbic Acid (VITAMIN C PO) Take by mouth.    . Calcium Citrate-Vitamin D (CITRACAL/VITAMIN D) 250-200 MG-UNIT TABS Take 2 tablets by mouth daily.    . Coenzyme Q10 (COQ-10 PO) Take by mouth.    . Glucosamine 500 MG CAPS Take by mouth.    Marland Kitchen NADH-ASCORBIC ACID-SOD BICARB PO Take by mouth.    . valACYclovir (VALTREX) 500 MG tablet Take 1 tablet (500 mg total) by mouth 2 (two) times daily. 20 tablet 5  . VITAMIN D, ERGOCALCIFEROL, PO Take by mouth.     No current facility-administered medications on file prior to visit.      Objective:  Objective  Physical Exam  Constitutional: She is oriented to person, place, and time. She appears well-developed and well-nourished.  HENT:  Head: Normocephalic and atraumatic.  Eyes: Conjunctivae and EOM are normal.  Neck: Normal range of motion. Neck supple. No JVD present. Carotid bruit is not present. No thyromegaly present.  Cardiovascular: Normal rate, regular rhythm and normal heart sounds.   No murmur heard. Pulmonary/Chest: Effort normal and breath sounds normal. No respiratory distress. She has no wheezes. She has no rales. She exhibits no tenderness.  Abdominal: There is tenderness. There is no rebound and no guarding.  Musculoskeletal: She exhibits no edema.  Neurological: She is alert and oriented to person, place, and time.  Psychiatric: She has a normal mood and affect. Her behavior is normal.  Nursing note and vitals reviewed.  BP 118/78 (BP Location: Left Arm, Patient Position: Sitting, Cuff  Size: Normal)   Pulse 76   Temp 98.6 F (37 C) (Oral)   Wt 197 lb 6.4 oz (89.5 kg)   SpO2 95%   BMI 33.88 kg/m  Wt Readings from Last 3 Encounters:  10/06/15 197 lb 6.4 oz (89.5 kg)  07/08/15 190 lb (86.2 kg)  09/16/14 184 lb (83.5 kg)     Lab Results  Component Value Date   WBC 6.2 07/11/2015   HGB 13.5 07/11/2015   HCT  41.1 07/11/2015   PLT 371.0 07/11/2015   GLUCOSE 110 (H) 07/11/2015   CHOL 235 (H) 07/11/2015   TRIG 80.0 07/11/2015   HDL 48.20 07/11/2015   LDLCALC 171 (H) 07/11/2015   ALT 35 07/11/2015   AST 23 07/11/2015   NA 139 07/11/2015   K 4.1 07/11/2015   CL 103 07/11/2015   CREATININE 0.84 07/11/2015   BUN 18 07/11/2015   CO2 29 07/11/2015   TSH 2.501 08/15/2013    Mm Screening Breast Tomo Bilateral  Result Date: 08/28/2013 CLINICAL DATA:  Screening. EXAM: DIGITAL SCREENING BILATERAL MAMMOGRAM WITH 3D TOMO WITH CAD COMPARISON:  Previous exam(s). ACR Breast Density Category b: There are scattered areas of fibroglandular density. FINDINGS: There are no findings suspicious for malignancy. Images were processed with CAD. IMPRESSION: No mammographic evidence of malignancy. A result letter of this screening mammogram will be mailed directly to the patient. RECOMMENDATION: Screening mammogram in one year. (Code:SM-B-01Y) BI-RADS CATEGORY  1: Negative. Electronically Signed   By: Conchita Paris M.D.   On: 08/28/2013 10:40     Assessment & Plan:  Plan  I am having Lacey Johns start on hyoscyamine. I am also having her maintain her Calcium Citrate-Vitamin D, albuterol, Glucosamine, NADH-ASCORBIC ACID-SOD BICARB PO, Coenzyme Q10 (COQ-10 PO), Ascorbic Acid (VITAMIN C PO), (VITAMIN D, ERGOCALCIFEROL, PO), valACYclovir, escitalopram, buPROPion, pantoprazole, cyclobenzaprine, and lisinopril-hydrochlorothiazide.  Meds ordered this encounter  Medications  . hyoscyamine (LEVSIN/SL) 0.125 MG SL tablet    Sig: Place 1 tablet (0.125 mg total) under the tongue every 4 (four) hours as needed.    Dispense:  30 tablet    Refill:  0  . escitalopram (LEXAPRO) 20 MG tablet    Sig: Take 1 tablet (20 mg total) by mouth daily.    Dispense:  30 tablet    Refill:  5  . buPROPion (WELLBUTRIN XL) 150 MG 24 hr tablet    Sig: 1 po qd    Dispense:  90 tablet    Refill:  3    Requested drug refills are authorized,  however, the patient needs further evaluation and/or laboratory testing before further refills are given. Ask her to make an appointment for this.  . pantoprazole (PROTONIX) 40 MG tablet    Sig: Take 1 tablet (40 mg total) by mouth 2 (two) times daily.    Dispense:  90 tablet    Refill:  3  . cyclobenzaprine (FLEXERIL) 10 MG tablet    Sig: Take 1/2 or one whole tablet bid for muscle spasms    Dispense:  30 tablet    Refill:  0  . lisinopril-hydrochlorothiazide (PRINZIDE,ZESTORETIC) 10-12.5 MG tablet    Sig: Take 1 tablet by mouth daily.    Dispense:  90 tablet    Refill:  3    Problem List Items Addressed This Visit      Unprioritized   GERD (gastroesophageal reflux disease)   Relevant Medications   hyoscyamine (LEVSIN/SL) 0.125 MG SL tablet   pantoprazole (PROTONIX) 40 MG tablet  Other Relevant Orders   Ambulatory referral to Gastroenterology   H. pylori antibody, IgG   HTN (hypertension)   Relevant Medications   lisinopril-hydrochlorothiazide (PRINZIDE,ZESTORETIC) 10-12.5 MG tablet   Other Relevant Orders   CBC with Differential/Platelet    Other Visit Diagnoses    IBS (irritable bowel syndrome)    -  Primary   Relevant Medications   hyoscyamine (LEVSIN/SL) 0.125 MG SL tablet   pantoprazole (PROTONIX) 40 MG tablet   Other Relevant Orders   Ambulatory referral to Gastroenterology   Thyroid Panel With TSH   Fecal occult blood, imunochemical   Generalized anxiety disorder       Hyperlipidemia       Relevant Medications   lisinopril-hydrochlorothiazide (PRINZIDE,ZESTORETIC) 10-12.5 MG tablet   Other Relevant Orders   Lipid panel   Comprehensive metabolic panel   Anxiety disorder, unspecified anxiety disorder type       Relevant Medications   escitalopram (LEXAPRO) 20 MG tablet   buPROPion (WELLBUTRIN XL) 150 MG 24 hr tablet   cyclobenzaprine (FLEXERIL) 10 MG tablet      Follow-up: Return in about 6 months (around 04/07/2016) for hypertension, hyperlipidemia,  anxiety.  Ann Held, DO

## 2015-10-06 NOTE — Progress Notes (Signed)
Pre visit review using our clinic review tool, if applicable. No additional management support is needed unless otherwise documented below in the visit note. 

## 2015-10-06 NOTE — Patient Instructions (Signed)
Food Choices for Gastroesophageal Reflux Disease, Adult When you have gastroesophageal reflux disease (GERD), the foods you eat and your eating habits are very important. Choosing the right foods can help ease the discomfort of GERD. WHAT GENERAL GUIDELINES DO I NEED TO FOLLOW?  Choose fruits, vegetables, whole grains, low-fat dairy products, and low-fat meat, fish, and poultry.  Limit fats such as oils, salad dressings, butter, nuts, and avocado.  Keep a food diary to identify foods that cause symptoms.  Avoid foods that cause reflux. These may be different for different people.  Eat frequent small meals instead of three large meals each day.  Eat your meals slowly, in a relaxed setting.  Limit fried foods.  Cook foods using methods other than frying.  Avoid drinking alcohol.  Avoid drinking large amounts of liquids with your meals.  Avoid bending over or lying down until 2-3 hours after eating. WHAT FOODS ARE NOT RECOMMENDED? The following are some foods and drinks that may worsen your symptoms: Vegetables Tomatoes. Tomato juice. Tomato and spaghetti sauce. Chili peppers. Onion and garlic. Horseradish. Fruits Oranges, grapefruit, and lemon (fruit and juice). Meats High-fat meats, fish, and poultry. This includes hot dogs, ribs, ham, sausage, salami, and bacon. Dairy Whole milk and chocolate milk. Sour cream. Cream. Butter. Ice cream. Cream cheese.  Beverages Coffee and tea, with or without caffeine. Carbonated beverages or energy drinks. Condiments Hot sauce. Barbecue sauce.  Sweets/Desserts Chocolate and cocoa. Donuts. Peppermint and spearmint. Fats and Oils High-fat foods, including French fries and potato chips. Other Vinegar. Strong spices, such as black pepper, white pepper, red pepper, cayenne, curry powder, cloves, ginger, and chili powder. The items listed above may not be a complete list of foods and beverages to avoid. Contact your dietitian for more  information.   This information is not intended to replace advice given to you by your health care provider. Make sure you discuss any questions you have with your health care provider.   Document Released: 02/01/2005 Document Revised: 02/22/2014 Document Reviewed: 12/06/2012 Elsevier Interactive Patient Education 2016 Elsevier Inc.  

## 2015-11-12 ENCOUNTER — Other Ambulatory Visit: Payer: Self-pay

## 2015-11-12 DIAGNOSIS — F419 Anxiety disorder, unspecified: Secondary | ICD-10-CM

## 2015-11-12 MED ORDER — CYCLOBENZAPRINE HCL 10 MG PO TABS: ORAL_TABLET | ORAL | 0 refills | Status: DC

## 2015-11-12 NOTE — Telephone Encounter (Signed)
Refill x1 

## 2015-11-12 NOTE — Telephone Encounter (Signed)
Medication filled to pharmacy as requested.   

## 2015-11-12 NOTE — Telephone Encounter (Signed)
Last seen 10/06/15 and filled 10/06/15 #30  Please advise    KP

## 2015-11-17 ENCOUNTER — Other Ambulatory Visit: Payer: Self-pay | Admitting: Family Medicine

## 2015-11-17 DIAGNOSIS — F419 Anxiety disorder, unspecified: Secondary | ICD-10-CM

## 2015-12-22 ENCOUNTER — Other Ambulatory Visit: Payer: Self-pay

## 2015-12-22 MED ORDER — CYCLOBENZAPRINE HCL 10 MG PO TABS: ORAL_TABLET | ORAL | 0 refills | Status: DC

## 2016-01-28 ENCOUNTER — Telehealth: Payer: Self-pay | Admitting: Family Medicine

## 2016-01-28 MED ORDER — ESCITALOPRAM OXALATE 20 MG PO TABS: 20.0000 mg | ORAL_TABLET | Freq: Every day | ORAL | 5 refills | Status: DC

## 2016-01-28 MED ORDER — CYCLOBENZAPRINE HCL 10 MG PO TABS: ORAL_TABLET | ORAL | 0 refills | Status: DC

## 2016-01-28 NOTE — Telephone Encounter (Signed)
Relation to [id number] Call back number: [phone] Pharmacy: [facility], [place] - [address]   Reason for call:  Patient requesting a refill escitalopram (LEXAPRO) 20 MG tablet and cyclobenzaprine (FLEXERIL) 10 MG tablet, patient states as per pharmacy Rx never received and patient has been completely out of medication.

## 2016-01-28 NOTE — Telephone Encounter (Signed)
Last ov 10/06/15. Last fill 10/06/15 #30 5. Please advise. LB

## 2016-01-28 NOTE — Addendum Note (Signed)
Addended by: Debbrah Alar on: 01/28/2016 01:47 PM   Modules accepted: Orders

## 2016-01-28 NOTE — Telephone Encounter (Signed)
Refills sent

## 2016-02-20 DIAGNOSIS — E538 Deficiency of other specified B group vitamins: Secondary | ICD-10-CM | POA: Diagnosis not present

## 2016-02-20 DIAGNOSIS — E782 Mixed hyperlipidemia: Secondary | ICD-10-CM | POA: Diagnosis not present

## 2016-02-20 DIAGNOSIS — E559 Vitamin D deficiency, unspecified: Secondary | ICD-10-CM | POA: Diagnosis not present

## 2016-02-27 ENCOUNTER — Encounter: Payer: Self-pay | Admitting: Family Medicine

## 2016-02-27 ENCOUNTER — Ambulatory Visit (INDEPENDENT_AMBULATORY_CARE_PROVIDER_SITE_OTHER): Payer: 59 | Admitting: Family Medicine

## 2016-02-27 DIAGNOSIS — E782 Mixed hyperlipidemia: Secondary | ICD-10-CM | POA: Diagnosis not present

## 2016-02-27 DIAGNOSIS — M654 Radial styloid tenosynovitis [de Quervain]: Secondary | ICD-10-CM

## 2016-02-27 DIAGNOSIS — E538 Deficiency of other specified B group vitamins: Secondary | ICD-10-CM | POA: Diagnosis not present

## 2016-02-27 DIAGNOSIS — E559 Vitamin D deficiency, unspecified: Secondary | ICD-10-CM | POA: Diagnosis not present

## 2016-02-27 MED ORDER — HYDROCODONE-ACETAMINOPHEN 5-325 MG PO TABS: 1.0000 | ORAL_TABLET | Freq: Four times a day (QID) | ORAL | 0 refills | Status: DC | PRN

## 2016-02-27 MED ORDER — METHYLPREDNISOLONE ACETATE 40 MG/ML IJ SUSP
20.0000 mg | Freq: Once | INTRAMUSCULAR | Status: AC
  Administered 2016-02-27: 20 mg via INTRA_ARTICULAR

## 2016-02-27 NOTE — Patient Instructions (Signed)
You have deQuervain's tenosynovitis of your thumb/wrist. Avoid painful activities as much as possible. Consider thumb spica brace as often as possible to rest this. Ibuprofen 600mg  three times a day with food OR aleve 2 tabs twice a day with food for pain and inflammation. norco as needed for severe pain (no driving on this). Ice 15 minutes at a time 3-4 times a day. A cortisone injection typically helps a great deal with this and is an option - you were given this today. Follow up with me in 1 month for reevaluation. Call me if not improving after a week.

## 2016-03-01 DIAGNOSIS — M25531 Pain in right wrist: Secondary | ICD-10-CM | POA: Insufficient documentation

## 2016-03-01 NOTE — Assessment & Plan Note (Signed)
current exam negative for carpal tunnel despite prior problems.  Consistent with dequervains.  Discussed options - she decided to go ahead with injection.  Thumb spica, ibuprofen or aleve.  Norco for severe pain.  Icing as needed.  F/u in 1 month.  After informed written consent patient was seated in chair in exam room.  Area overlying right 1st dorsal compartment prepped with alcohol swab then injected with 0.5:0.59mL bupivicaine: depomedrol.  Patient tolerated procedure well without immediate complications.

## 2016-03-01 NOTE — Progress Notes (Signed)
PCP: Ann Held, DO  Subjective:   HPI: Patient is a 61 y.o. female here for right hand pain.  Patient reports she's had 3 days of radial sided wrist pain. No acute injury or trauma. No increase in activity level either. Pain is 7/10, sharp. Worse with motions of thumb. Feels like it will catch at times. Right handed. Has known carpal tunnel as well. No skin changes, numbness.  Past Medical History:  Diagnosis Date  . Abnormal mammogram of right breast   . Abnormal Pap smear of cervix   . Ankle fracture, right   . Asthma   . Carpal tunnel syndrome, bilateral   . Diabetes mellitus without complication ([place])   . GERD (gastroesophageal reflux disease)   . Hypertension     Current Outpatient Prescriptions on File Prior to Visit  Medication Sig Dispense Refill  . albuterol (PROVENTIL HFA;VENTOLIN HFA) 108 (90 BASE) MCG/ACT inhaler Inhale 2 puffs into the lungs every 6 (six) hours as needed for wheezing or shortness of breath. 1 Inhaler 3  . Ascorbic Acid (VITAMIN C PO) Take by mouth.    Marland Kitchen buPROPion (WELLBUTRIN XL) 150 MG 24 hr tablet 1 po qd 90 tablet 3  . Calcium Citrate-Vitamin D (CITRACAL/VITAMIN D) 250-200 MG-UNIT TABS Take 2 tablets by mouth daily.    . Coenzyme Q10 (COQ-10 PO) Take by mouth.    . cyclobenzaprine (FLEXERIL) 10 MG tablet Take 1/2 or one whole tablet bid for muscle spasms 30 tablet 0  . escitalopram (LEXAPRO) 20 MG tablet Take 1 tablet (20 mg total) by mouth daily. 30 tablet 5  . Glucosamine 500 MG CAPS Take by mouth.    . hyoscyamine (LEVSIN/SL) 0.125 MG SL tablet Place 1 tablet (0.125 mg total) under the tongue every 4 (four) hours as needed. 30 tablet 0  . lisinopril-hydrochlorothiazide (PRINZIDE,ZESTORETIC) 10-12.5 MG tablet Take 1 tablet by mouth daily. 90 tablet 3  . NADH-ASCORBIC ACID-SOD BICARB PO Take by mouth.    . valACYclovir (VALTREX) 500 MG tablet Take 1 tablet (500 mg total) by mouth 2 (two) times daily. 20 tablet 5  . VITAMIN D,  ERGOCALCIFEROL, PO Take by mouth.     No current facility-administered medications on file prior to visit.     Past Surgical History:  Procedure Laterality Date  . CERVICAL CONE BIOPSY    . NASAL SEPTUM SURGERY    . NECK SURGERY    . OVARIAN CYST REMOVAL    . TONSILLECTOMY    . WRIST SURGERY      No Known Allergies  Social History   Social History  . Marital status: Married    Spouse name: N/A  . Number of children: N/A  . Years of education: N/A   Occupational History  . Not on file.   Social History Main Topics  . Smoking status: Never Smoker  . Smokeless tobacco: Never Used  . Alcohol use No  . Drug use: No  . Sexual activity: Yes    Partners: Male   Other Topics Concern  . Not on file   Social History Narrative  . No narrative on file    Family History  Problem Relation Age of Onset  . Hypertension Mother   . Hypertension Father   . Hyperlipidemia Father   . Diabetes Father   . Breast cancer Maternal Aunt   . ALS Maternal Aunt   . Parkinson's disease Maternal Aunt     BP 113/63   Pulse (!) 102  Ht 5\' 4"  (1.626 m)   Wt 180 lb (81.6 kg)   BMI 30.90 kg/m   Review of Systems: See HPI above.     Objective:  Physical Exam:  Gen: NAD, comfortable in exam room  Right hand: No gross deformity, swelling, bruising. TTP 1st dorsal compartment.  No 1st CMC, carpal tunnel, other tenderness. FROM digits and wrist.  Pain on thumb motions. + finkelsteins. Negative tinels. Sensation intact to light touch.   Left hand: FROm without pain.  Assessment & Plan:  1. Right dequervains tenosynovitis - current exam negative for carpal tunnel despite prior problems.  Consistent with dequervains.  Discussed options - she decided to go ahead with injection.  Thumb spica, ibuprofen or aleve.  Norco for severe pain.  Icing as needed.  F/u in 1 month.  After informed written consent patient was seated in chair in exam room.  Area overlying right 1st dorsal  compartment prepped with alcohol swab then injected with 0.5:0.35mL bupivicaine: depomedrol.  Patient tolerated procedure well without immediate complications.

## 2016-03-05 DIAGNOSIS — E559 Vitamin D deficiency, unspecified: Secondary | ICD-10-CM | POA: Diagnosis not present

## 2016-03-05 DIAGNOSIS — E538 Deficiency of other specified B group vitamins: Secondary | ICD-10-CM | POA: Diagnosis not present

## 2016-03-05 DIAGNOSIS — R7303 Prediabetes: Secondary | ICD-10-CM | POA: Diagnosis not present

## 2016-04-12 DIAGNOSIS — E538 Deficiency of other specified B group vitamins: Secondary | ICD-10-CM | POA: Diagnosis not present

## 2016-04-12 DIAGNOSIS — N951 Menopausal and female climacteric states: Secondary | ICD-10-CM | POA: Diagnosis not present

## 2016-04-12 DIAGNOSIS — E782 Mixed hyperlipidemia: Secondary | ICD-10-CM | POA: Diagnosis not present

## 2016-04-12 DIAGNOSIS — R7303 Prediabetes: Secondary | ICD-10-CM | POA: Diagnosis not present

## 2016-04-14 DIAGNOSIS — N951 Menopausal and female climacteric states: Secondary | ICD-10-CM | POA: Diagnosis not present

## 2016-04-23 DIAGNOSIS — R7303 Prediabetes: Secondary | ICD-10-CM | POA: Diagnosis not present

## 2016-04-23 DIAGNOSIS — E538 Deficiency of other specified B group vitamins: Secondary | ICD-10-CM | POA: Diagnosis not present

## 2016-04-23 DIAGNOSIS — E782 Mixed hyperlipidemia: Secondary | ICD-10-CM | POA: Diagnosis not present

## 2016-05-03 ENCOUNTER — Telehealth: Payer: Self-pay | Admitting: *Deleted

## 2016-05-03 MED ORDER — CYCLOBENZAPRINE HCL 10 MG PO TABS: ORAL_TABLET | ORAL | 0 refills | Status: DC

## 2016-05-03 NOTE — Telephone Encounter (Signed)
Refill of cyclobenzaprine was sent to pharmacy. Pt is past due for follow up with Dr Carollee Herter. Please call pt to schedule appt soon. Thanks!

## 2016-05-03 NOTE — Telephone Encounter (Signed)
Received fax from [facility] requesting refills of cyclobenzaprine 10mg . 1/2 to 1 tablet twice daily for muscle spasms.  Last RX:  01/28/16, #30 Last OV:  10/06/15 Next OV:  Past due. Was due 04/07/16 for 6 month follow up.  Please advise request?

## 2016-05-03 NOTE — Telephone Encounter (Signed)
Refill x1 

## 2016-07-27 ENCOUNTER — Encounter: Payer: Self-pay | Admitting: Family Medicine

## 2016-07-27 ENCOUNTER — Ambulatory Visit (INDEPENDENT_AMBULATORY_CARE_PROVIDER_SITE_OTHER): Payer: 59 | Admitting: Family Medicine

## 2016-07-27 VITALS — BP 120/72 | HR 82 | Temp 98.6°F | Resp 16 | Ht 64.0 in | Wt 182.0 lb

## 2016-07-27 DIAGNOSIS — M62838 Other muscle spasm: Secondary | ICD-10-CM | POA: Diagnosis not present

## 2016-07-27 DIAGNOSIS — K219 Gastro-esophageal reflux disease without esophagitis: Secondary | ICD-10-CM

## 2016-07-27 DIAGNOSIS — I10 Essential (primary) hypertension: Secondary | ICD-10-CM | POA: Diagnosis not present

## 2016-07-27 DIAGNOSIS — J069 Acute upper respiratory infection, unspecified: Secondary | ICD-10-CM

## 2016-07-27 DIAGNOSIS — F32A Depression, unspecified: Secondary | ICD-10-CM

## 2016-07-27 DIAGNOSIS — F329 Major depressive disorder, single episode, unspecified: Secondary | ICD-10-CM | POA: Diagnosis not present

## 2016-07-27 MED ORDER — FLUTICASONE PROPIONATE 50 MCG/ACT NA SUSP: 2.0000 | Freq: Every day | NASAL | 6 refills | Status: DC

## 2016-07-27 MED ORDER — BUPROPION HCL ER (XL) 150 MG PO TB24: ORAL_TABLET | ORAL | 3 refills | Status: DC

## 2016-07-27 MED ORDER — LEVOCETIRIZINE DIHYDROCHLORIDE 5 MG PO TABS: 5.0000 mg | ORAL_TABLET | Freq: Every evening | ORAL | 5 refills | Status: DC

## 2016-07-27 MED ORDER — CYCLOBENZAPRINE HCL 10 MG PO TABS: ORAL_TABLET | ORAL | 0 refills | Status: DC

## 2016-07-27 MED ORDER — ESCITALOPRAM OXALATE 20 MG PO TABS: 20.0000 mg | ORAL_TABLET | Freq: Every day | ORAL | 5 refills | Status: DC

## 2016-07-27 MED ORDER — LISINOPRIL-HYDROCHLOROTHIAZIDE 10-12.5 MG PO TABS: 1.0000 | ORAL_TABLET | Freq: Every day | ORAL | 3 refills | Status: DC

## 2016-07-27 MED ORDER — ESOMEPRAZOLE MAGNESIUM 40 MG PO PACK: 40.0000 mg | PACK | Freq: Every day | ORAL | 3 refills | Status: DC

## 2016-07-27 NOTE — Patient Instructions (Signed)
Infección del tracto respiratorio superior, adultos  (Upper Respiratory Infection, Adult)  La mayoría de las infecciones del tracto respiratorio superior están causadas por un virus. Un infección del tracto respiratorio superior afecta la nariz, la garganta y las vías respiratorias superiores. El tipo más común de infección del tracto respiratorio superior es el resfrío común.  CUIDADOS EN EL HOGAR  · Tome los medicamentos solamente como se lo haya indicado el médico.  · A fin de aliviar el dolor de garganta, haga gárgaras con solución salina templada o consuma caramelos para la tos, como se lo haya indicado el médico.  · Use un humidificador de vapor cálido o inhale el vapor de la ducha para aumentar la humedad del aire. Esto facilitará la respiración.  · Beba suficiente líquido para mantener el pis (orina) claro o de color amarillo pálido.  · Tome sopas y caldos transparentes.  · Siga una dieta saludable.  · Descanse todo lo que sea necesario.  · Regrese al trabajo cuando la fiebre haya desaparecido o el médico le diga que puede hacerlo.  ? Es posible que deba quedarse en su casa durante un tiempo prolongado, para no transmitir la infección a los demás.  ? También puede usar un barbijo y lavarse las manos con frecuencia para evitar el contagio del virus.  · Si tiene asma, use el inhalador con mayor frecuencia.  · No consuma ningún producto que contenga tabaco, lo que incluye cigarrillos, tabaco de mascar o cigarrillos electrónicos. Si necesita ayuda para dejar de fumar, consulte al médico.  SOLICITE AYUDA SI:  · Siente que empeora o que no mejora.  · Los medicamentos no logran aliviar los síntomas.  · Tiene escalofríos.  · La dificultad para respirar es peor.  · Tiene mucosidad marrón o roja.  · Tiene una secreción amarilla o marrón de la nariz.  · Le duele la cara, especialmente al inclinarse hacia adelante.  · Tiene fiebre.  · Tiene los ganglios del cuello hinchados.  · Siente dolor al tragar.   · Tiene zonas blancas en la parte de atrás de la garganta.  SOLICITE AYUDA DE INMEDIATO SI:  · Los siguientes síntomas son muy intensos o constantes:  ? Dolor de cabeza.  ? Dolor de oídos.  ? Dolor en la frente, detrás de los ojos y por encima de los pómulos (dolor sinusal).  ? Dolor en el pecho.  · Tiene enfermedad pulmonar prolongada (crónica) y cualquiera de estos síntomas:  ? Sibilancias.  ? Tos prolongada.  ? Tos con sangre.  ? Cambio en la mucosidad habitual.  · Presenta rigidez en el cuello.  · Tiene cambios en:  ? La visión.  ? La audición.  ? El pensamiento.  ? El estado de ánimo.  ASEGÚRESE DE QUE:  · Comprende estas instrucciones.  · Controlará su afección.  · Recibirá ayuda de inmediato si no mejora o si empeora.  Esta información no tiene como fin reemplazar el consejo del médico. Asegúrese de hacerle al médico cualquier pregunta que tenga.  Document Released: 07/06/2010 Document Revised: 06/18/2014 Document Reviewed: 05/09/2013  Elsevier Interactive Patient Education © 2018 Elsevier Inc.

## 2016-07-27 NOTE — Progress Notes (Signed)
Patient ID: Lacey Johns, female    DOB: Aug 09, 1955  Age: 61 y.o. MRN: [id number]    Subjective:  Subjective  HPI Lacey Johns presents for pain L side of face, jaw, cheek and behind eye and teeth.  It started over the weekend.    Took advil cold and sinus with little relief.  Pt is clenching jaw at night.  She has run out of several meds and that could be causing some of the symptoms as weill--- c/o inc reflux symptoms-- she ran out of her nexium   Review of Systems  Constitutional: Negative for chills and fever.  HENT: Positive for congestion, postnasal drip, rhinorrhea and sinus pressure.   Respiratory: Positive for cough. Negative for chest tightness, shortness of breath and wheezing.   Cardiovascular: Negative for chest pain, palpitations and leg swelling.  Allergic/Immunologic: Negative for environmental allergies.    History Past Medical History:  Diagnosis Date  . Abnormal mammogram of right breast   . Abnormal Pap smear of cervix   . Ankle fracture, right   . Asthma   . Carpal tunnel syndrome, bilateral   . Diabetes mellitus without complication ([place])   . GERD (gastroesophageal reflux disease)   . Hypertension     She has a past surgical history that includes Tonsillectomy; Ovarian cyst removal; Cervical cone biopsy; Wrist surgery; Nasal septum surgery; and Neck surgery.   Her family history includes ALS in her maternal aunt; Breast cancer in her maternal aunt; Diabetes in her father; Hyperlipidemia in her father; Hypertension in her father and mother; Parkinson's disease in her maternal aunt.She reports that she has never smoked. She has never used smokeless tobacco. She reports that she does not drink alcohol or use drugs.  Current Outpatient Prescriptions on File Prior to Visit  Medication Sig Dispense Refill  . albuterol (PROVENTIL HFA;VENTOLIN HFA) 108 (90 BASE) MCG/ACT inhaler Inhale 2 puffs into the lungs every 6 (six) hours as needed for wheezing or shortness of  breath. 1 Inhaler 3  . Ascorbic Acid (VITAMIN C PO) Take by mouth.    . Calcium Citrate-Vitamin D (CITRACAL/VITAMIN D) 250-200 MG-UNIT TABS Take 2 tablets by mouth daily.    . Coenzyme Q10 (COQ-10 PO) Take by mouth.    . esomeprazole (NEXIUM) 40 MG capsule Take 40 mg by mouth.    . Glucosamine 500 MG CAPS Take by mouth.    Marland Kitchen HYDROcodone-acetaminophen (NORCO) 5-325 MG tablet Take 1 tablet by mouth every 6 (six) hours as needed for moderate pain. 20 tablet 0  . NADH-ASCORBIC ACID-SOD BICARB PO Take by mouth.    . valACYclovir (VALTREX) 500 MG tablet Take 1 tablet (500 mg total) by mouth 2 (two) times daily. 20 tablet 5  . VITAMIN D, ERGOCALCIFEROL, PO Take by mouth.     No current facility-administered medications on file prior to visit.      Objective:  Objective  Physical Exam  Constitutional: She is oriented to person, place, and time. She appears well-developed and well-nourished.  HENT:  Right Ear: External ear normal.  Left Ear: External ear normal.  Nose: No rhinorrhea or sinus tenderness. Right sinus exhibits no maxillary sinus tenderness and no frontal sinus tenderness. Left sinus exhibits no maxillary sinus tenderness and no frontal sinus tenderness.  + PND + errythema  Eyes: Conjunctivae are normal. Right eye exhibits no discharge. Left eye exhibits no discharge.  Cardiovascular: Normal rate, regular rhythm and normal heart sounds.   No murmur heard. Pulmonary/Chest: Effort normal and breath sounds  normal. No respiratory distress. She has no wheezes. She has no rales. She exhibits no tenderness.  Musculoskeletal: She exhibits no edema.  Lymphadenopathy:    She has no cervical adenopathy.  Neurological: She is alert and oriented to person, place, and time.  Nursing note and vitals reviewed.  BP 120/72 (BP Location: Right Arm, Patient Position: Sitting, Cuff Size: Normal)   Pulse 82   Temp 98.6 F (37 C) (Oral)   Resp 16   Ht 5\' 4"  (1.626 m)   Wt 182 lb (82.6 kg)    SpO2 95%   BMI 31.24 kg/m  Wt Readings from Last 3 Encounters:  07/27/16 182 lb (82.6 kg)  02/27/16 180 lb (81.6 kg)  10/06/15 197 lb 6.4 oz (89.5 kg)     Lab Results  Component Value Date   WBC 5.8 10/06/2015   HGB 13.5 10/06/2015   HCT 39.9 10/06/2015   PLT 328.0 10/06/2015   GLUCOSE 110 (H) 10/06/2015   CHOL 221 (H) 10/06/2015   TRIG 95.0 10/06/2015   HDL 60.10 10/06/2015   LDLCALC 142 (H) 10/06/2015   ALT 29 10/06/2015   AST 22 10/06/2015   NA 136 10/06/2015   K 3.8 10/06/2015   CL 101 10/06/2015   CREATININE 0.77 10/06/2015   BUN 12 10/06/2015   CO2 27 10/06/2015   TSH 1.88 10/06/2015    Mm Screening Breast Tomo Bilateral  Result Date: 08/27/2013 CLINICAL DATA:  Screening. EXAM: DIGITAL SCREENING BILATERAL MAMMOGRAM WITH 3D TOMO WITH CAD COMPARISON:  Previous exam(s). ACR Breast Density Category b: There are scattered areas of fibroglandular density. FINDINGS: There are no findings suspicious for malignancy. Images were processed with CAD. IMPRESSION: No mammographic evidence of malignancy. A result letter of this screening mammogram will be mailed directly to the patient. RECOMMENDATION: Screening mammogram in one year. (Code:SM-B-01Y) BI-RADS CATEGORY  1: Negative. Electronically Signed   By: Conchita Paris M.D.   On: 08/28/2013 10:40     Assessment & Plan:  Plan  I have discontinued Ms. Buda's hyoscyamine. I am also having her start on esomeprazole, levocetirizine, and fluticasone. Additionally, I am having her maintain her Calcium Citrate-Vitamin D, albuterol, Glucosamine, NADH-ASCORBIC ACID-SOD BICARB PO, Coenzyme Q10 (COQ-10 PO), Ascorbic Acid (VITAMIN C PO), (VITAMIN D, ERGOCALCIFEROL, PO), valACYclovir, HYDROcodone-acetaminophen, esomeprazole, cyclobenzaprine, buPROPion, lisinopril-hydrochlorothiazide, and escitalopram.  Meds ordered this encounter  Medications  . cyclobenzaprine (FLEXERIL) 10 MG tablet    Sig: Take 1/2 or one whole tablet bid for muscle  spasms    Dispense:  30 tablet    Refill:  0  . buPROPion (WELLBUTRIN XL) 150 MG 24 hr tablet    Sig: 1 po qd    Dispense:  90 tablet    Refill:  3    Requested drug refills are authorized, however, the patient needs further evaluation and/or laboratory testing before further refills are given. Ask her to make an appointment for this.  Marland Kitchen lisinopril-hydrochlorothiazide (PRINZIDE,ZESTORETIC) 10-12.5 MG tablet    Sig: Take 1 tablet by mouth daily.    Dispense:  90 tablet    Refill:  3  . escitalopram (LEXAPRO) 20 MG tablet    Sig: Take 1 tablet (20 mg total) by mouth daily.    Dispense:  30 tablet    Refill:  5  . esomeprazole (NEXIUM) 40 MG packet    Sig: Take 40 mg by mouth daily before breakfast.    Dispense:  90 each    Refill:  3  . levocetirizine (XYZAL) 5  MG tablet    Sig: Take 1 tablet (5 mg total) by mouth every evening.    Dispense:  30 tablet    Refill:  5  . fluticasone (FLONASE) 50 MCG/ACT nasal spray    Sig: Place 2 sprays into both nostrils daily.    Dispense:  16 g    Refill:  6    Problem List Items Addressed This Visit      Unprioritized   GERD (gastroesophageal reflux disease) - Primary   Relevant Medications   esomeprazole (NEXIUM) 40 MG packet   HTN (hypertension)   Relevant Medications   lisinopril-hydrochlorothiazide (PRINZIDE,ZESTORETIC) 10-12.5 MG tablet   Acute upper respiratory infection    otc meds rto or call if symptoms worsen      Relevant Medications   levocetirizine (XYZAL) 5 MG tablet   fluticasone (FLONASE) 50 MCG/ACT nasal spray    Other Visit Diagnoses    Depression, unspecified depression type       Relevant Medications   buPROPion (WELLBUTRIN XL) 150 MG 24 hr tablet   escitalopram (LEXAPRO) 20 MG tablet   Muscle spasm       Relevant Medications   cyclobenzaprine (FLEXERIL) 10 MG tablet                    Follow-up: Return if symptoms worsen or fail to improve.  Ann Held, DO

## 2016-07-28 ENCOUNTER — Encounter: Payer: Self-pay | Admitting: Family Medicine

## 2016-07-28 DIAGNOSIS — J069 Acute upper respiratory infection, unspecified: Secondary | ICD-10-CM | POA: Insufficient documentation

## 2016-07-28 NOTE — Assessment & Plan Note (Signed)
otc meds rto or call if symptoms worsen

## 2016-07-30 DIAGNOSIS — N951 Menopausal and female climacteric states: Secondary | ICD-10-CM | POA: Diagnosis not present

## 2016-08-02 DIAGNOSIS — N951 Menopausal and female climacteric states: Secondary | ICD-10-CM | POA: Diagnosis not present

## 2016-08-10 ENCOUNTER — Other Ambulatory Visit: Payer: Self-pay | Admitting: *Deleted

## 2016-08-10 DIAGNOSIS — I10 Essential (primary) hypertension: Secondary | ICD-10-CM

## 2016-08-10 MED ORDER — LISINOPRIL-HYDROCHLOROTHIAZIDE 10-12.5 MG PO TABS: 1.0000 | ORAL_TABLET | Freq: Every day | ORAL | 3 refills | Status: DC

## 2016-08-30 ENCOUNTER — Other Ambulatory Visit: Payer: Self-pay | Admitting: Family Medicine

## 2016-08-30 DIAGNOSIS — Z1231 Encounter for screening mammogram for malignant neoplasm of breast: Secondary | ICD-10-CM

## 2016-09-03 ENCOUNTER — Telehealth: Payer: Self-pay

## 2016-09-03 ENCOUNTER — Other Ambulatory Visit: Payer: Self-pay | Admitting: Family Medicine

## 2016-09-03 DIAGNOSIS — I10 Essential (primary) hypertension: Secondary | ICD-10-CM

## 2016-09-03 DIAGNOSIS — M62838 Other muscle spasm: Secondary | ICD-10-CM

## 2016-09-03 MED ORDER — LISINOPRIL-HYDROCHLOROTHIAZIDE 10-12.5 MG PO TABS: 1.0000 | ORAL_TABLET | Freq: Every day | ORAL | 3 refills | Status: DC

## 2016-09-03 MED ORDER — CYCLOBENZAPRINE HCL 10 MG PO TABS: ORAL_TABLET | ORAL | 0 refills | Status: DC

## 2016-09-03 NOTE — Telephone Encounter (Signed)
Caller name: Relation to VU:YEBX Call back [place]  Reason for call: pt states she is needing rx lisinopril-hydrochlorothiazide (PRINZIDE,ZESTORETIC) 10-12.5 MG tablet, protonix, and flexarill, sent to her pharmacy states she is out of her bp meds please send asap.

## 2016-09-03 NOTE — Telephone Encounter (Signed)
Ask pt about protonix because se sent in nexium  Flexeril x1 Lisinopril for 3 months

## 2016-09-03 NOTE — Telephone Encounter (Signed)
Called patient to inform Lisinopril -Hctz was called in to [facility] today.

## 2016-09-03 NOTE — Telephone Encounter (Signed)
Sent in BP med/and flexeril Called the patient left message to call back.

## 2016-09-03 NOTE — Telephone Encounter (Signed)
Advise on flexeril refill last ov 07/27/2016 Last refill #30 on 07/27/2016  Also requested protonix-- but nexium is on list?

## 2016-09-07 ENCOUNTER — Ambulatory Visit
Admission: RE | Admit: 2016-09-07 | Discharge: 2016-09-07 | Disposition: A | Discharge status: Home / Self Care | Payer: 59 | Source: Ambulatory Visit | Attending: Family Medicine | Admitting: Family Medicine

## 2016-09-07 DIAGNOSIS — Z1231 Encounter for screening mammogram for malignant neoplasm of breast: Secondary | ICD-10-CM

## 2016-09-18 ENCOUNTER — Emergency Department (HOSPITAL_BASED_OUTPATIENT_CLINIC_OR_DEPARTMENT_OTHER): Payer: 59

## 2016-09-18 ENCOUNTER — Emergency Department (HOSPITAL_BASED_OUTPATIENT_CLINIC_OR_DEPARTMENT_OTHER)
Admission: EM | Admit: 2016-09-18 | Discharge: 2016-09-18 | Disposition: A | Discharge status: Home / Self Care | Payer: 59 | Attending: Emergency Medicine | Admitting: Emergency Medicine

## 2016-09-18 ENCOUNTER — Encounter (HOSPITAL_BASED_OUTPATIENT_CLINIC_OR_DEPARTMENT_OTHER): Payer: Self-pay | Admitting: *Deleted

## 2016-09-18 DIAGNOSIS — Y9289 Other specified places as the place of occurrence of the external cause: Secondary | ICD-10-CM | POA: Diagnosis not present

## 2016-09-18 DIAGNOSIS — E119 Type 2 diabetes mellitus without complications: Secondary | ICD-10-CM | POA: Insufficient documentation

## 2016-09-18 DIAGNOSIS — R0602 Shortness of breath: Secondary | ICD-10-CM | POA: Diagnosis not present

## 2016-09-18 DIAGNOSIS — J45909 Unspecified asthma, uncomplicated: Secondary | ICD-10-CM | POA: Diagnosis not present

## 2016-09-18 DIAGNOSIS — S139XXA Sprain of joints and ligaments of unspecified parts of neck, initial encounter: Secondary | ICD-10-CM | POA: Diagnosis not present

## 2016-09-18 DIAGNOSIS — Y999 Unspecified external cause status: Secondary | ICD-10-CM | POA: Diagnosis not present

## 2016-09-18 DIAGNOSIS — W19XXXA Unspecified fall, initial encounter: Secondary | ICD-10-CM

## 2016-09-18 DIAGNOSIS — S199XXA Unspecified injury of neck, initial encounter: Secondary | ICD-10-CM | POA: Insufficient documentation

## 2016-09-18 DIAGNOSIS — W08XXXA Fall from other furniture, initial encounter: Secondary | ICD-10-CM | POA: Diagnosis not present

## 2016-09-18 DIAGNOSIS — S0990XA Unspecified injury of head, initial encounter: Secondary | ICD-10-CM | POA: Diagnosis present

## 2016-09-18 DIAGNOSIS — Z79899 Other long term (current) drug therapy: Secondary | ICD-10-CM | POA: Insufficient documentation

## 2016-09-18 DIAGNOSIS — M542 Cervicalgia: Secondary | ICD-10-CM | POA: Diagnosis not present

## 2016-09-18 DIAGNOSIS — S299XXA Unspecified injury of thorax, initial encounter: Secondary | ICD-10-CM | POA: Diagnosis not present

## 2016-09-18 DIAGNOSIS — Y93B9 Activity, other involving muscle strengthening exercises: Secondary | ICD-10-CM | POA: Insufficient documentation

## 2016-09-18 DIAGNOSIS — I10 Essential (primary) hypertension: Secondary | ICD-10-CM | POA: Insufficient documentation

## 2016-09-18 DIAGNOSIS — M549 Dorsalgia, unspecified: Secondary | ICD-10-CM | POA: Diagnosis not present

## 2016-09-18 DIAGNOSIS — S140XXA Concussion and edema of cervical spinal cord, initial encounter: Secondary | ICD-10-CM | POA: Diagnosis not present

## 2016-09-18 LAB — CBC WITH DIFFERENTIAL/PLATELET
Basophils Absolute: 0 10*3/uL (ref 0.0–0.1)
Basophils Relative: 0 %
Eosinophils Absolute: 0.2 10*3/uL (ref 0.0–0.7)
Eosinophils Relative: 2 %
HCT: 40.3 % (ref 36.0–46.0)
Hemoglobin: 13.8 g/dL (ref 12.0–15.0)
Lymphocytes Relative: 34 %
Lymphs Abs: 2.5 10*3/uL (ref 0.7–4.0)
MCH: 29.2 pg (ref 26.0–34.0)
MCHC: 34.2 g/dL (ref 30.0–36.0)
MCV: 85.2 fL (ref 78.0–100.0)
Monocytes Absolute: 0.5 10*3/uL (ref 0.1–1.0)
Monocytes Relative: 7 %
Neutro Abs: 4.1 10*3/uL (ref 1.7–7.7)
Neutrophils Relative %: 57 %
Platelets: 279 10*3/uL (ref 150–400)
RBC: 4.73 MIL/uL (ref 3.87–5.11)
RDW: 14.6 % (ref 11.5–15.5)
WBC: 7.3 10*3/uL (ref 4.0–10.5)

## 2016-09-18 LAB — BASIC METABOLIC PANEL
Anion gap: 10 (ref 5–15)
BUN: 21 mg/dL — ABNORMAL HIGH (ref 6–20)
CO2: 25 mmol/L (ref 22–32)
Calcium: 9 mg/dL (ref 8.9–10.3)
Chloride: 101 mmol/L (ref 101–111)
Creatinine, Ser: 0.93 mg/dL (ref 0.44–1.00)
GFR calc Af Amer: >60 mL/min (ref >60)
GFR calc non Af Amer: >60 mL/min (ref >60)
Glucose, Bld: 87 mg/dL (ref 65–99)
Potassium: 3.8 mmol/L (ref 3.5–5.1)
Sodium: 136 mmol/L (ref 135–145)

## 2016-09-18 LAB — TROPONIN I: Troponin I: <0.03 ng/mL (ref <0.03)

## 2016-09-18 MED ORDER — OXYCODONE-ACETAMINOPHEN 5-325 MG PO TABS
1.0000 | ORAL_TABLET | Freq: Once | ORAL | Status: AC
  Administered 2016-09-18: 1 via ORAL
  Filled 2016-09-18: qty 1

## 2016-09-18 MED ORDER — METHYLPREDNISOLONE 4 MG PO TBPK: ORAL_TABLET | ORAL | 0 refills | Status: DC

## 2016-09-18 MED ORDER — LORAZEPAM 2 MG/ML IJ SOLN
1.0000 mg | Freq: Once | INTRAMUSCULAR | Status: AC
  Administered 2016-09-18: 1 mg via INTRAVENOUS
  Filled 2016-09-18: qty 1

## 2016-09-18 MED ORDER — OXYCODONE-ACETAMINOPHEN 5-325 MG PO TABS: 1.0000 | ORAL_TABLET | Freq: Four times a day (QID) | ORAL | 0 refills | Status: DC | PRN

## 2016-09-18 MED ORDER — MORPHINE SULFATE (PF) 4 MG/ML IV SOLN
4.0000 mg | Freq: Once | INTRAVENOUS | Status: AC
  Administered 2016-09-18: 4 mg via INTRAVENOUS
  Filled 2016-09-18: qty 1

## 2016-09-18 NOTE — ED Provider Notes (Signed)
[facility]PT [facility] Provider Note   CSN: [id number] Arrival date & time: 09/18/16  1351     History   Chief Complaint Chief Complaint  Patient presents with  . Fall    HPI Lacey Johns is a 61 y.o. female with history of asthma, cervical fusion surgery several years ago who presents with neck pain after falling off of her inversion table. Patient reports her feet were not strapped and she fell and hit her head and she returned neck. Patient did not loose consciousness. She denies any headache, dizziness, lightheadedness, however reports significant neck pain and upper back pain. She has also had shortness of breath and chest heaviness since yesterday. She does feel anxious. She did not have symptoms beforehand. Patient also has minor abrasions to the right arm or catching herself on a screw of the machine. Her tetanus is up-to-date. Patient called her surgeon who advised taking Aleve and if it did not improve, come to the ED. Her neck pain is worsening. Patient denies any recent long trips, cancer, surgeries, new leg pain or swelling, or history of blood clots.  HPI  Past Medical History:  Diagnosis Date  . Abnormal mammogram of right breast   . Abnormal Pap smear of cervix   . Ankle fracture, right   . Asthma   . Carpal tunnel syndrome, bilateral   . Diabetes mellitus without complication ([place])   . GERD (gastroesophageal reflux disease)   . Hypertension     Patient Active Problem List   Diagnosis Date Noted  . Acute upper respiratory infection 07/28/2016  . De Quervain's tenosynovitis, right 03/01/2016  . HTN (hypertension) 08/15/2013  . Irritability 08/15/2013  . Adjustment disorder with anxious mood 08/15/2013  . FH: breast cancer in first degree relative  Mother , GM, maunt 08/15/2013  . Cervical cancer  pt reports CA in situ S/P conization college age 45/02/2013  . GERD (gastroesophageal reflux disease) 08/15/2013  . Exercise-induced asthma 08/15/2013  . Carpal  tunnel syndrome 08/15/2013  . Right ankle injury 04/04/2013  . Right foot injury 04/04/2013    Past Surgical History:  Procedure Laterality Date  . CERVICAL CONE BIOPSY    . NASAL SEPTUM SURGERY    . NECK SURGERY    . OVARIAN CYST REMOVAL    . TONSILLECTOMY    . WRIST SURGERY      OB History    Gravida Para Term Preterm AB Living   3         3   SAB TAB Ectopic Multiple Live Births                   Home Medications    Prior to Admission medications   Medication Sig Start Date End Date Taking? Authorizing Provider  albuterol (PROVENTIL HFA;VENTOLIN HFA) 108 (90 BASE) MCG/ACT inhaler Inhale 2 puffs into the lungs every 6 (six) hours as needed for wheezing or shortness of breath. 09/16/14   Ann Held, DO  Ascorbic Acid (VITAMIN C PO) Take by mouth.    [provider]  buPROPion (WELLBUTRIN XL) 150 MG 24 hr tablet 1 po qd 07/27/16   Carollee Herter, Alferd Apa, DO  Calcium Citrate-Vitamin D (CITRACAL/VITAMIN D) 250-200 MG-UNIT TABS Take 2 tablets by mouth daily.    [provider]  Coenzyme Q10 (COQ-10 PO) Take by mouth.    [provider]  cyclobenzaprine (FLEXERIL) 10 MG tablet Take 1/2 or one whole tablet bid for muscle spasms 09/03/16  Carollee Herter, Yvonne R, DO  escitalopram (LEXAPRO) 20 MG tablet Take 1 tablet (20 mg total) by mouth daily. 07/27/16   Ann Held, DO  esomeprazole (NEXIUM) 40 MG capsule Take 40 mg by mouth. 10/29/15 10/28/16  [provider]  esomeprazole (NEXIUM) 40 MG packet Take 40 mg by mouth daily before breakfast. 07/27/16   Carollee Herter, Alferd Apa, DO  fluticasone (FLONASE) 50 MCG/ACT nasal spray Place 2 sprays into both nostrils daily. 07/27/16   Roma Schanz R, DO  Glucosamine 500 MG CAPS Take by mouth.    [provider]  HYDROcodone-acetaminophen (NORCO) 5-325 MG tablet Take 1 tablet by mouth every 6 (six) hours as needed for moderate pain. 02/27/16   Hudnall, Sharyn Lull, MD  levocetirizine  (XYZAL) 5 MG tablet Take 1 tablet (5 mg total) by mouth every evening. 07/27/16   Ann Held, DO  lisinopril-hydrochlorothiazide (PRINZIDE,ZESTORETIC) 10-12.5 MG tablet Take 1 tablet by mouth daily. 09/03/16   Roma Schanz R, DO  NADH-ASCORBIC ACID-SOD BICARB PO Take by mouth.    [provider]  valACYclovir (VALTREX) 500 MG tablet Take 1 tablet (500 mg total) by mouth 2 (two) times daily. 07/08/15   Roma Schanz R, DO  VITAMIN D, ERGOCALCIFEROL, PO Take by mouth.    [provider]    Family History Family History  Problem Relation Age of Onset  . Hypertension Mother   . Hypertension Father   . Hyperlipidemia Father   . Diabetes Father   . Breast cancer Maternal Aunt   . ALS Maternal Aunt   . Parkinson's disease Maternal Aunt     Social History Social History  Substance Use Topics  . Smoking status: Never Smoker  . Smokeless tobacco: Never Used  . Alcohol use No     Allergies   Patient has no known allergies.   Review of Systems Review of Systems  Constitutional: Negative for chills and fever.  HENT: Negative for facial swelling and sore throat.   Respiratory: Positive for chest tightness and shortness of breath.   Cardiovascular: Positive for chest pain.  Gastrointestinal: Negative for abdominal pain, nausea and vomiting.  Genitourinary: Negative for dysuria.  Musculoskeletal: Positive for back pain and neck pain.  Skin: Negative for rash and wound.  Neurological: Negative for dizziness, light-headedness and headaches.  Psychiatric/Behavioral: The patient is not nervous/anxious.      Physical Exam Updated Vital Signs BP 131/84 (BP Location: Left Arm)   Pulse 65   Temp 98.2 F (36.8 C) (Oral)   Resp 18   Ht 5\' 4"  (1.626 m)   Wt 84.8 kg (187 lb)   SpO2 98%   BMI 32.10 kg/m   Physical Exam  Constitutional: She appears well-developed and well-nourished. No distress.  HENT:  Head: Normocephalic and atraumatic.    Mouth/Throat: Oropharynx is clear and moist. No oropharyngeal exudate.  Eyes: Pupils are equal, round, and reactive to light. Conjunctivae are normal. Right eye exhibits no discharge. Left eye exhibits no discharge. No scleral icterus.  Neck: Normal range of motion. Neck supple. No thyromegaly present.  Cardiovascular: Normal rate, regular rhythm, normal heart sounds and intact distal pulses.  Exam reveals no gallop and no friction rub.   No murmur heard. Pulmonary/Chest: Effort normal and breath sounds normal. No stridor. No respiratory distress. She has no wheezes. She has no rales. She exhibits no tenderness.  Abdominal: Soft. Bowel sounds are normal. She exhibits no distension. There is no tenderness. There  is no rebound and no guarding.  Musculoskeletal: She exhibits no edema.  Midline cervical and thoracic tenderness; no midline lumbar tenderness  Lymphadenopathy:    She has no cervical adenopathy.  Neurological: She is alert. Coordination normal.  CN 3-12 intact; normal sensation throughout; 5/5 strength in all 4 extremities; equal bilateral grip strength  Skin: Skin is warm and dry. No rash noted. She is not diaphoretic. No pallor.  Minor abrasion and small skin tear to right forearm, no active bleeding  Psychiatric: She has a normal mood and affect.  Nursing note and vitals reviewed.    ED Treatments / Results  Labs (all labs ordered are listed, but only abnormal results are displayed) Labs Reviewed  BASIC METABOLIC PANEL - Abnormal; Notable for the following:       Result Value   BUN 21 (*)    All other components within normal limits  CBC WITH DIFFERENTIAL/PLATELET  TROPONIN I    EKG  EKG Interpretation  Date/Time:  Saturday September 18 2016 14:01:02 EDT Ventricular Rate:  65 PR Interval:  180 QRS Duration: 84 QT Interval:  438 QTC Calculation: 455 R Axis:   5 Text Interpretation:  Normal sinus rhythm Low voltage QRS Borderline ECG No old tracing to compare  Confirmed by Dorie Rank [phone]) on 09/18/2016 2:07:42 PM       Radiology Dg Chest 2 View  Result Date: 09/18/2016 CLINICAL DATA:  [place] on head and neck. Complains of anterior chest pain with shortness of breath. Mid thoracic pain. EXAM: CHEST  2 VIEW COMPARISON:  09/18/2016 thoracic spine.  Chest radiograph 10/06/2007 FINDINGS: Cardiac sticker overlying the right chest. Lungs are clear. Negative for a pneumothorax. Heart and mediastinum are within normal limits. Surgical plate in the lower cervical spine. The trachea is midline. No acute bone abnormality. No large pleural effusions. No gross abnormality to the sternum on the lateral view. IMPRESSION: No active cardiopulmonary disease. Electronically Signed   By: Markus Daft M.D.   On: 09/18/2016 15:39   Dg Thoracic Spine W/swimmers  Result Date: 09/18/2016 CLINICAL DATA:  [place] onto head and neck. Complains of upper back pain. EXAM: THORACIC SPINE - 3 VIEWS COMPARISON:  Chest radiograph 10/06/2007 FINDINGS: Surgical plate in the lower cervical spine at C4-C6. Straightening in the cervical spine. Alignment at the cervicothoracic junction is grossly normal. Minimal curvature in the midthoracic spine. Vertebral body heights are maintained. No evidence for a compression deformity. Visualized ribs are intact. Normal appearance of the visualized clavicles. IMPRESSION: No acute abnormality. Electronically Signed   By: Markus Daft M.D.   On: 09/18/2016 15:43   Ct Cervical Spine Wo Contrast  Result Date: 09/18/2016 CLINICAL DATA:  Pt fell on head and twisted neck; fell upside down from inverter exerciser machine; prior sx in 2010 for disc herniation; pain in lower c-spine and through shoulders EXAM: CT CERVICAL SPINE WITHOUT CONTRAST TECHNIQUE: Multidetector CT imaging of the cervical spine was performed without intravenous contrast. Multiplanar CT image reconstructions were also generated. COMPARISON:  11/21/2015, 10/02/2007 FINDINGS: Alignment: Status post anterior  fusion C4-C6. There is reversal of the normal cervical lordosis. There is 2 mm anterolisthesis of C7 on T1. Skull base and vertebrae: No acute fracture. No primary bone lesion or focal pathologic process. Soft tissues and spinal canal: No prevertebral fluid or swelling. No visible canal hematoma. Disc levels:  Disc height loss at C6-7 and C7-T1. Upper chest: Negative. Other: None IMPRESSION: 1. No acute fracture. 2. Postoperative changes. 3. Grade 1 anterolisthesis of  C7 on T1. Electronically Signed   By: Nolon Nations M.D.   On: 09/18/2016 15:40    Procedures Procedures (including critical care time)  Medications Ordered in ED Medications  morphine 4 MG/ML injection 4 mg (4 mg Intravenous Given 09/18/16 1457)  LORazepam (ATIVAN) injection 1 mg (1 mg Intravenous Given 09/18/16 1654)     Initial Impression / Assessment and Plan / ED Course  I have reviewed the triage vital signs and the nursing notes.  Pertinent labs & imaging results that were available during my care of the patient were reviewed by me and considered in my medical decision making (see chart for details).     Patient with severe neck pain after a fall from inversion table. CT of the C-spine shows grade 1 anterolisthesis of C7-T1. Concern for ligamentous injury. Patient placed in c-collar and MRIs ordered. MRI is pending at shift change. Labs are unremarkable including CBC, BMP, troponin. CXR is negative as well as thoracic spine x-ray. I feel patient's shortness of breath and chest pain may be related to anxiety, however delta troponin will be ordered. At shift change, patient care transferred to [facility], PA-C for continued evaluation, follow up of MRI and determination of disposition. I discussed patient case with Dr. Tomi Bamberger who guided the patient's management and agrees with plan.    Final Clinical Impressions(s) / ED Diagnoses   Final diagnoses:  Neck pain  Fall, initial encounter    New Prescriptions New  Prescriptions   No medications on file     Caryl Ada 09/18/16 1658    Dorie Rank, MD 09/19/16 [phone]

## 2016-09-18 NOTE — Discharge Instructions (Signed)
Please call Dr. Ronnald Ramp office on Monday morning to schedule a follow-up appointment. Please follow the directions on the methylprednisolone Dosepak. You may take 1 tablet of Percocet every 6 hours as needed for severe pain. Please note this is a narcotic medication and can be addictive. Please do not take more than indicated because it can cause your breathing to decrease.  If he develop new or worsening symptoms including numbness, tingling, or if you have any fall or injury, please return to the emergency department for reevaluation.

## 2016-09-18 NOTE — ED Triage Notes (Signed)
Pt c/o fall with head injury x 2 hrs ago, states SOB and CP after injury

## 2016-09-18 NOTE — ED Provider Notes (Signed)
Patient received at sign out from a PA Law who is awaiting the results of an MRI. Per her HPI,  "Lacey Johns is a 61 y.o. female with history of asthma, cervical fusion surgery several years ago who presents with neck pain after falling off of her inversion table. Patient reports her feet were not strapped and she fell and hit her head and she returned neck. Patient did not loose consciousness. She denies any headache, dizziness, lightheadedness, however reports significant neck pain and upper back pain. She has also had shortness of breath and chest heaviness since yesterday. She does feel anxious. She did not have symptoms beforehand. Patient also has minor abrasions to the right arm or catching herself on a screw of the machine. Her tetanus is up-to-date. Patient called her surgeon who advised taking Aleve and if it did not improve, come to the ED. Her neck pain is worsening. Patient denies any recent long trips, cancer, surgeries, new leg pain or swelling, or history of blood clots."   Physical Exam  BP 124/88 (BP Location: Right Arm)   Pulse 78   Temp 98.4 F (36.9 C) (Oral)   Resp 18   Ht 5\' 4"  (1.626 m)   Wt 84.8 kg (187 lb)   SpO2 93%   BMI 32.10 kg/m   Physical Exam  Cranial nerves 2-12 intact. Finger-to-nose is normal. 5/5 motor strength of the bilateral upper and lower extremities. Moves all four extremities. Negative Romberg. Ambulatory without difficulty. NVI.   ED Course  Procedures  MDM 62 year old female presenting with acute neck pain after a fall off of her inversion table. MRI demonstrating edema and progressive canal stenosis, no definite findings of the ligament of injury. Discussed the patient with Dr. Dina Rich, attending physician. No focal neuro deficits. Cervical collar removed. We'll discharge the patient to home with follow-up to her neurosurgeon, Dr. Ronnald Ramp, pain medication, and I methylprednisolone Dosepak. Strict return precautions given. Vital signs stable. 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 No acute distress. The patient is safe for discharge at this time.       Joanne Gavel, PA-C 09/18/16 1939    Merryl Hacker, MD 09/20/16 [phone]

## 2016-10-26 ENCOUNTER — Telehealth: Payer: Self-pay

## 2016-10-26 ENCOUNTER — Other Ambulatory Visit: Payer: Self-pay

## 2016-10-26 ENCOUNTER — Encounter: Payer: Self-pay | Admitting: Family Medicine

## 2016-10-26 MED ORDER — ESOMEPRAZOLE MAGNESIUM 40 MG PO CPDR: 40.0000 mg | DELAYED_RELEASE_CAPSULE | Freq: Every day | ORAL | 3 refills | Status: DC

## 2016-10-26 NOTE — Telephone Encounter (Signed)
PA initiated via Covermymeds; KEY: [id number]. Awaiting determination.

## 2016-10-27 NOTE — Telephone Encounter (Signed)
Request Reference Number: [id number]. ESOMEPRA MAG CAP 40MG  DR is denied for not meeting the prior authorization requirement(s). For further questions, call [phone].

## 2016-10-27 NOTE — Telephone Encounter (Signed)
Denial notification received. PA denied. Nexium is plan exclusion and not covered under Pt's plan. Please advise.

## 2016-10-28 ENCOUNTER — Telehealth: Payer: Self-pay

## 2016-10-28 MED ORDER — DEXLANSOPRAZOLE 60 MG PO CPDR: 60.0000 mg | DELAYED_RELEASE_CAPSULE | Freq: Every day | ORAL | 2 refills | Status: DC

## 2016-10-28 NOTE — Telephone Encounter (Signed)
Rx sent 

## 2016-10-28 NOTE — Telephone Encounter (Signed)
We need to know what they will cover---- does pt have formulary

## 2016-10-28 NOTE — Telephone Encounter (Signed)
Pt previously on pantoprazole which didn't help, could try omeprazole, or Dexilant.

## 2016-10-28 NOTE — Telephone Encounter (Signed)
This medication is on your plan's list of covered drugs. Prior authorization is not required at this time. If your pharmacy has questions regarding the processing of your prescription, please have them call the [facility] pharmacy help desk at [phone]. For additional information, the member can contact Member Services by calling the number on the back of their ID Card.   [facility] informed.

## 2016-10-28 NOTE — Telephone Encounter (Signed)
dexilant 60 mg #90 1 po qd, 2 refills

## 2016-10-28 NOTE — Telephone Encounter (Signed)
PA initiated via Covermymeds; KEY: [id number]. Awaiting determination.

## 2016-10-29 DIAGNOSIS — M67472 Ganglion, left ankle and foot: Secondary | ICD-10-CM | POA: Diagnosis not present

## 2016-10-29 DIAGNOSIS — R52 Pain, unspecified: Secondary | ICD-10-CM | POA: Diagnosis not present

## 2016-10-29 DIAGNOSIS — M79672 Pain in left foot: Secondary | ICD-10-CM | POA: Diagnosis not present

## 2016-11-08 DIAGNOSIS — Z79891 Long term (current) use of opiate analgesic: Secondary | ICD-10-CM | POA: Diagnosis not present

## 2016-11-08 DIAGNOSIS — M47812 Spondylosis without myelopathy or radiculopathy, cervical region: Secondary | ICD-10-CM | POA: Diagnosis not present

## 2016-11-08 DIAGNOSIS — M47816 Spondylosis without myelopathy or radiculopathy, lumbar region: Secondary | ICD-10-CM | POA: Diagnosis not present

## 2016-11-08 DIAGNOSIS — G894 Chronic pain syndrome: Secondary | ICD-10-CM | POA: Insufficient documentation

## 2016-11-08 DIAGNOSIS — M5030 Other cervical disc degeneration, unspecified cervical region: Secondary | ICD-10-CM | POA: Diagnosis not present

## 2016-11-24 DIAGNOSIS — M47812 Spondylosis without myelopathy or radiculopathy, cervical region: Secondary | ICD-10-CM | POA: Diagnosis not present

## 2016-11-24 DIAGNOSIS — M5030 Other cervical disc degeneration, unspecified cervical region: Secondary | ICD-10-CM | POA: Diagnosis not present

## 2016-12-09 DIAGNOSIS — Z780 Asymptomatic menopausal state: Secondary | ICD-10-CM | POA: Diagnosis not present

## 2016-12-13 ENCOUNTER — Encounter: Payer: Self-pay | Admitting: Family Medicine

## 2016-12-13 DIAGNOSIS — Z0289 Encounter for other administrative examinations: Secondary | ICD-10-CM

## 2016-12-16 ENCOUNTER — Encounter: Payer: Self-pay | Admitting: Family Medicine

## 2016-12-16 DIAGNOSIS — Z0289 Encounter for other administrative examinations: Secondary | ICD-10-CM

## 2017-01-31 ENCOUNTER — Ambulatory Visit: Payer: 59 | Admitting: Family Medicine

## 2017-01-31 ENCOUNTER — Encounter: Payer: Self-pay | Admitting: Family Medicine

## 2017-01-31 DIAGNOSIS — M654 Radial styloid tenosynovitis [de Quervain]: Secondary | ICD-10-CM | POA: Diagnosis not present

## 2017-01-31 MED ORDER — METHYLPREDNISOLONE ACETATE 40 MG/ML IJ SUSP
20.0000 mg | Freq: Once | INTRAMUSCULAR | Status: AC
  Administered 2017-01-31: 20 mg via INTRA_ARTICULAR

## 2017-01-31 NOTE — Patient Instructions (Signed)
You have deQuervain's tenosynovitis of your thumb/wrist. Avoid painful activities as much as possible. Consider thumb spica brace as often as possible to rest this. Ibuprofen 600mg  three times a day with food OR aleve 2 tabs twice a day with food for pain and inflammation. Ice 15 minutes at a time 3-4 times a day. A cortisone injection typically helps a great deal with this and is an option - you were given this today. Follow up with me in 1 month for reevaluation.

## 2017-02-01 ENCOUNTER — Encounter: Payer: Self-pay | Admitting: Family Medicine

## 2017-02-01 NOTE — Progress Notes (Signed)
PCP: Ann Held, DO  Subjective:   HPI: Patient is a 61 y.o. female here for right hand pain.  1/12: Patient reports she's had 3 days of radial sided wrist pain. No acute injury or trauma. No increase in activity level either. Pain is 7/10, sharp. Worse with motions of thumb. Feels like it will catch at times. Right handed. Has known carpal tunnel as well. No skin changes, numbness.  12/17: Patient reports she completely improved following last visit. Then about 1 month ago started to get pain radial side of right wrist. She is on computer a lot at work and also driving. Pain worse with these. Started using brace, icing, trying to rest this. Pain level 9/10 and sharp. No skin changes, numbness but has some localized tingling.  Past Medical History:  Diagnosis Date  . Abnormal mammogram of right breast   . Abnormal Pap smear of cervix   . Ankle fracture, right   . Asthma   . Carpal tunnel syndrome, bilateral   . Diabetes mellitus without complication ([place])   . GERD (gastroesophageal reflux disease)   . Hypertension     Current Outpatient Medications on File Prior to Visit  Medication Sig Dispense Refill  . albuterol (PROVENTIL HFA;VENTOLIN HFA) 108 (90 BASE) MCG/ACT inhaler Inhale 2 puffs into the lungs every 6 (six) hours as needed for wheezing or shortness of breath. 1 Inhaler 3  . Ascorbic Acid (VITAMIN C PO) Take by mouth.    Marland Kitchen buPROPion (WELLBUTRIN XL) 150 MG 24 hr tablet 1 po qd 90 tablet 3  . Calcium Citrate-Vitamin D (CITRACAL/VITAMIN D) 250-200 MG-UNIT TABS Take 2 tablets by mouth daily.    . Coenzyme Q10 (COQ-10 PO) Take by mouth.    . cyclobenzaprine (FLEXERIL) 10 MG tablet Take 1/2 or one whole tablet bid for muscle spasms 30 tablet 0  . dexlansoprazole (DEXILANT) 60 MG capsule Take 1 capsule (60 mg total) by mouth daily. 90 capsule 2  . escitalopram (LEXAPRO) 20 MG tablet Take 1 tablet (20 mg total) by mouth daily. 30 tablet 5  . fluticasone  (FLONASE) 50 MCG/ACT nasal spray Place 2 sprays into both nostrils daily. 16 g 6  . Glucosamine 500 MG CAPS Take by mouth.    Marland Kitchen HYDROcodone-acetaminophen (NORCO) 5-325 MG tablet Take 1 tablet by mouth every 6 (six) hours as needed for moderate pain. 20 tablet 0  . levocetirizine (XYZAL) 5 MG tablet Take 1 tablet (5 mg total) by mouth every evening. 30 tablet 5  . lisinopril-hydrochlorothiazide (PRINZIDE,ZESTORETIC) 10-12.5 MG tablet Take 1 tablet by mouth daily. 90 tablet 3  . methylPREDNISolone (MEDROL DOSEPAK) 4 MG TBPK tablet Take as directed on packet. 21 tablet 0  . NADH-ASCORBIC ACID-SOD BICARB PO Take by mouth.    . oxyCODONE-acetaminophen (PERCOCET/ROXICET) 5-325 MG tablet Take 1 tablet by mouth every 6 (six) hours as needed for severe pain. 15 tablet 0  . valACYclovir (VALTREX) 500 MG tablet Take 1 tablet (500 mg total) by mouth 2 (two) times daily. 20 tablet 5  . VITAMIN D, ERGOCALCIFEROL, PO Take by mouth.     No current facility-administered medications on file prior to visit.     Past Surgical History:  Procedure Laterality Date  . CERVICAL CONE BIOPSY    . NASAL SEPTUM SURGERY    . NECK SURGERY    . OVARIAN CYST REMOVAL    . TONSILLECTOMY    . WRIST SURGERY      No Known Allergies  Social  History   Socioeconomic History  . Marital status: Married    Spouse name: Not on file  . Number of children: Not on file  . Years of education: Not on file  . Highest education level: Not on file  Social Needs  . Financial resource strain: Not on file  . Food insecurity - worry: Not on file  . Food insecurity - inability: Not on file  . Transportation needs - medical: Not on file  . Transportation needs - non-medical: Not on file  Occupational History  . Not on file  Tobacco Use  . Smoking status: Never Smoker  . Smokeless tobacco: Never Used  Substance and Sexual Activity  . Alcohol use: No    Alcohol/week: 0.0 oz  . Drug use: No  . Sexual activity: Yes    Partners:  Male  Other Topics Concern  . Not on file  Social History Narrative  . Not on file    Family History  Problem Relation Age of Onset  . Hypertension Mother   . Hypertension Father   . Hyperlipidemia Father   . Diabetes Father   . Breast cancer Maternal Aunt   . ALS Maternal Aunt   . Parkinson's disease Maternal Aunt     BP 132/75   Pulse 73   Ht 5\' 4"  (1.626 m)   Wt 180 lb (81.6 kg)   BMI 30.90 kg/m   Review of Systems: See HPI above.     Objective:  Physical Exam:  Gen: NAD, comfortable in exam room.  Right hand: No gross deformity, swelling, bruising. TTP 1st dorsal compartment.  No 1st CMC, carpal tunnel, other tenderness. FROM digits and wrist.  Pain with thumb motions. 5/5 strength fingers and thumb motions though. + finkelsteins. Negative tinels. Sensation intact to light touch.  Assessment & Plan:  1. Right dequervains tenosynovitis - reviewed options - she was given injection today.  Consider using thumb spica again.  Ibuprofen or aleve.  Icing.  F/u in 1 month.  After informed written consent timeout was performed.  Patient was seated in chair in exam room.  Area overlying right 1st dorsal compartment prepped with alcohol swab then injected with 0.5:0.38mL bupivicaine: depomedrol.  Patient tolerated procedure well without immediate complications.

## 2017-02-01 NOTE — Assessment & Plan Note (Signed)
reviewed options - she was given injection today.  Consider using thumb spica again.  Ibuprofen or aleve.  Icing.  F/u in 1 month.  After informed written consent timeout was performed.  Patient was seated in chair in exam room.  Area overlying right 1st dorsal compartment prepped with alcohol swab then injected with 0.5:0.45mL bupivicaine: depomedrol.  Patient tolerated procedure well without immediate complications.

## 2017-03-10 ENCOUNTER — Telehealth: Payer: Self-pay | Admitting: Family Medicine

## 2017-03-10 ENCOUNTER — Other Ambulatory Visit: Payer: Self-pay

## 2017-03-10 DIAGNOSIS — F32A Depression, unspecified: Secondary | ICD-10-CM

## 2017-03-10 DIAGNOSIS — F329 Major depressive disorder, single episode, unspecified: Secondary | ICD-10-CM

## 2017-03-10 MED ORDER — ESCITALOPRAM OXALATE 20 MG PO TABS: 20.0000 mg | ORAL_TABLET | Freq: Every day | ORAL | 2 refills | Status: DC

## 2017-03-10 NOTE — Telephone Encounter (Signed)
Copied from [place] [id number]. Topic: Quick Communication - Rx Refill/Question >> Mar 10, 2017  8:23 AM Burnis Medin, NT wrote: Medication: escitalopram (LEXAPRO) 20 MG tablet   Has the patient contacted their pharmacy? Yes    (Agent: If no, request that the patient contact the pharmacy for the refill.)   Preferred Pharmacy (with phone number or street name): [place], [place] - [address] [phone] (Phone) [phone] (Fax)     Agent: Please be advised that RX refills may take up to 3 business days. We ask that you follow-up with your pharmacy.

## 2017-05-06 ENCOUNTER — Telehealth: Payer: Self-pay

## 2017-05-06 ENCOUNTER — Encounter: Payer: Self-pay | Admitting: Family Medicine

## 2017-05-06 ENCOUNTER — Ambulatory Visit (INDEPENDENT_AMBULATORY_CARE_PROVIDER_SITE_OTHER): Payer: 59 | Admitting: Family Medicine

## 2017-05-06 ENCOUNTER — Other Ambulatory Visit (HOSPITAL_COMMUNITY)
Admission: RE | Admit: 2017-05-06 | Discharge: 2017-05-06 | Disposition: A | Discharge status: Home / Self Care | Payer: 59 | Source: Ambulatory Visit | Attending: Family Medicine | Admitting: Family Medicine

## 2017-05-06 VITALS — BP 128/62 | HR 79 | Temp 98.1°F | Resp 16 | Ht 64.0 in | Wt 191.2 lb

## 2017-05-06 DIAGNOSIS — M62838 Other muscle spasm: Secondary | ICD-10-CM

## 2017-05-06 DIAGNOSIS — F329 Major depressive disorder, single episode, unspecified: Secondary | ICD-10-CM

## 2017-05-06 DIAGNOSIS — Z124 Encounter for screening for malignant neoplasm of cervix: Secondary | ICD-10-CM | POA: Diagnosis present

## 2017-05-06 DIAGNOSIS — Z0000 Encounter for general adult medical examination without abnormal findings: Secondary | ICD-10-CM | POA: Diagnosis not present

## 2017-05-06 DIAGNOSIS — Z1159 Encounter for screening for other viral diseases: Secondary | ICD-10-CM

## 2017-05-06 DIAGNOSIS — S140XXA Concussion and edema of cervical spinal cord, initial encounter: Secondary | ICD-10-CM | POA: Diagnosis not present

## 2017-05-06 DIAGNOSIS — Z23 Encounter for immunization: Secondary | ICD-10-CM | POA: Diagnosis not present

## 2017-05-06 DIAGNOSIS — B001 Herpesviral vesicular dermatitis: Secondary | ICD-10-CM

## 2017-05-06 DIAGNOSIS — R319 Hematuria, unspecified: Secondary | ICD-10-CM | POA: Diagnosis not present

## 2017-05-06 DIAGNOSIS — I10 Essential (primary) hypertension: Secondary | ICD-10-CM

## 2017-05-06 DIAGNOSIS — K581 Irritable bowel syndrome with constipation: Secondary | ICD-10-CM | POA: Diagnosis not present

## 2017-05-06 DIAGNOSIS — E782 Mixed hyperlipidemia: Secondary | ICD-10-CM | POA: Diagnosis not present

## 2017-05-06 DIAGNOSIS — F32A Depression, unspecified: Secondary | ICD-10-CM

## 2017-05-06 LAB — POC URINALSYSI DIPSTICK (AUTOMATED)
Bilirubin, UA: NEGATIVE
GLUCOSE UA: NEGATIVE
Ketones, UA: NEGATIVE
Leukocytes, UA: NEGATIVE
NITRITE UA: NEGATIVE
PH UA: 6 (ref 5.0–8.0)
Protein, UA: NEGATIVE
Spec Grav, UA: >=1.03 — AB (ref 1.010–1.025)
UROBILINOGEN UA: 0.2 U/dL

## 2017-05-06 MED ORDER — CYCLOBENZAPRINE HCL 10 MG PO TABS: ORAL_TABLET | ORAL | 0 refills | Status: DC

## 2017-05-06 MED ORDER — ESCITALOPRAM OXALATE 20 MG PO TABS: 20.0000 mg | ORAL_TABLET | Freq: Every day | ORAL | 2 refills | Status: DC

## 2017-05-06 MED ORDER — LISINOPRIL-HYDROCHLOROTHIAZIDE 10-12.5 MG PO TABS: 1.0000 | ORAL_TABLET | Freq: Every day | ORAL | 3 refills | Status: DC

## 2017-05-06 MED ORDER — LINACLOTIDE 290 MCG PO CAPS: 290.0000 ug | ORAL_CAPSULE | Freq: Every day | ORAL | 2 refills | Status: AC

## 2017-05-06 MED ORDER — VALACYCLOVIR HCL 500 MG PO TABS: 500.0000 mg | ORAL_TABLET | Freq: Two times a day (BID) | ORAL | 5 refills | Status: DC

## 2017-05-06 NOTE — Telephone Encounter (Signed)
PA initiated via Covermymeds; KEY: [id number]. Awaiting determination.

## 2017-05-06 NOTE — Patient Instructions (Signed)
Preventive Care 40-64 Years, Female Preventive care refers to lifestyle choices and visits with your health care provider that can promote health and wellness. What does preventive care include?  A yearly physical exam. This is also called an annual well check.  Dental exams once or twice a year.  Routine eye exams. Ask your health care provider how often you should have your eyes checked.  Personal lifestyle choices, including: ? Daily care of your teeth and gums. ? Regular physical activity. ? Eating a healthy diet. ? Avoiding tobacco and drug use. ? Limiting alcohol use. ? Practicing safe sex. ? Taking low-dose aspirin daily starting at age 58. ? Taking vitamin and mineral supplements as recommended by your health care provider. What happens during an annual well check? The services and screenings done by your health care provider during your annual well check will depend on your age, overall health, lifestyle risk factors, and family history of disease. Counseling Your health care provider may ask you questions about your:  Alcohol use.  Tobacco use.  Drug use.  Emotional well-being.  Home and relationship well-being.  Sexual activity.  Eating habits.  Work and work Statistician.  Method of birth control.  Menstrual cycle.  Pregnancy history.  Screening You may have the following tests or measurements:  Height, weight, and BMI.  Blood pressure.  Lipid and cholesterol levels. These may be checked every 5 years, or more frequently if you are over 81 years old.  Skin check.  Lung cancer screening. You may have this screening every year starting at age 78 if you have a 30-pack-year history of smoking and currently smoke or have quit within the past 15 years.  Fecal occult blood test (FOBT) of the stool. You may have this test every year starting at age 65.  Flexible sigmoidoscopy or colonoscopy. You may have a sigmoidoscopy every 5 years or a colonoscopy  every 10 years starting at age 30.  Hepatitis C blood test.  Hepatitis B blood test.  Sexually transmitted disease (STD) testing.  Diabetes screening. This is done by checking your blood sugar (glucose) after you have not eaten for a while (fasting). You may have this done every 1-3 years.  Mammogram. This may be done every 1-2 years. Talk to your health care provider about when you should start having regular mammograms. This may depend on whether you have a family history of breast cancer.  BRCA-related cancer screening. This may be done if you have a family history of breast, ovarian, tubal, or peritoneal cancers.  Pelvic exam and Pap test. This may be done every 3 years starting at age 80. Starting at age 36, this may be done every 5 years if you have a Pap test in combination with an HPV test.  Bone density scan. This is done to screen for osteoporosis. You may have this scan if you are at high risk for osteoporosis.  Discuss your test results, treatment options, and if necessary, the need for more tests with your health care provider. Vaccines Your health care provider may recommend certain vaccines, such as:  Influenza vaccine. This is recommended every year.  Tetanus, diphtheria, and acellular pertussis (Tdap, Td) vaccine. You may need a Td booster every 10 years.  Varicella vaccine. You may need this if you have not been vaccinated.  Zoster vaccine. You may need this after age 5.  Measles, mumps, and rubella (MMR) vaccine. You may need at least one dose of MMR if you were born in  1955 or later. You may also need a second dose.  Pneumococcal 13-valent conjugate (PCV13) vaccine. You may need this if you have certain conditions and were not previously vaccinated.  Pneumococcal polysaccharide (PPSV23) vaccine. You may need one or two doses if you smoke cigarettes or if you have certain conditions.  Meningococcal vaccine. You may need this if you have certain  conditions.  Hepatitis A vaccine. You may need this if you have certain conditions or if you travel or work in places where you may be exposed to hepatitis A.  Hepatitis B vaccine. You may need this if you have certain conditions or if you travel or work in places where you may be exposed to hepatitis B.  Haemophilus influenzae type b (Hib) vaccine. You may need this if you have certain conditions.  Talk to your health care provider about which screenings and vaccines you need and how often you need them. This information is not intended to replace advice given to you by your health care provider. Make sure you discuss any questions you have with your health care provider. Document Released: 02/28/2015 Document Revised: 10/22/2015 Document Reviewed: 12/03/2014 Elsevier Interactive Patient Education  2018 Elsevier Inc.  

## 2017-05-06 NOTE — Progress Notes (Signed)
Subjective:  I acted as a Education administrator for [employer]. [name], [place]   Patient ID: Lacey Johns, female    DOB: 08/10/1955, 62 y.o.   MRN: [id number]  Chief Complaint  Patient presents with  . Annual Exam    HPI  Patient is in today for annual exam.   No complaints  Patient Care Team: Carollee Herter, Alferd Apa, DO as PCP - General (Family Medicine) Patient, No Pcp Per (General Practice) Eustace Moore, MD as Consulting Physician (Neurosurgery)   Past Medical History:  Diagnosis Date  . Abnormal mammogram of right breast   . Abnormal Pap smear of cervix   . Ankle fracture, right   . Asthma   . Carpal tunnel syndrome, bilateral   . Diabetes mellitus without complication ([place])   . GERD (gastroesophageal reflux disease)   . Hypertension     Past Surgical History:  Procedure Laterality Date  . CERVICAL CONE BIOPSY    . NASAL SEPTUM SURGERY    . NECK SURGERY    . OVARIAN CYST REMOVAL    . TONSILLECTOMY    . WRIST SURGERY      Family History  Problem Relation Age of Onset  . Hypertension Mother   . Hypertension Father   . Hyperlipidemia Father   . Diabetes Father   . Breast cancer Maternal Aunt   . ALS Maternal Aunt   . Parkinson's disease Maternal Aunt     Social History   Socioeconomic History  . Marital status: Married    Spouse name: Not on file  . Number of children: Not on file  . Years of education: Not on file  . Highest education level: Not on file  Occupational History  . Not on file  Social Needs  . Financial resource strain: Not on file  . Food insecurity:    Worry: Not on file    Inability: Not on file  . Transportation needs:    Medical: Not on file    Non-medical: Not on file  Tobacco Use  . Smoking status: Never Smoker  . Smokeless tobacco: Never Used  Substance and Sexual Activity  . Alcohol use: No    Alcohol/week: 0.0 oz  . Drug use: No  . Sexual activity: Yes    Partners: Male  Lifestyle  . Physical activity:    Days per week:  Not on file    Minutes per session: Not on file  . Stress: Not on file  Relationships  . Social connections:    Talks on phone: Not on file    Gets together: Not on file    Attends religious service: Not on file    Active member of club or organization: Not on file    Attends meetings of clubs or organizations: Not on file    Relationship status: Not on file  . Intimate partner violence:    Fear of current or ex partner: Not on file    Emotionally abused: Not on file    Physically abused: Not on file    Forced sexual activity: Not on file  Other Topics Concern  . Not on file  Social History Narrative  . Not on file    Outpatient Medications Prior to Visit  Medication Sig Dispense Refill  . albuterol (PROVENTIL HFA;VENTOLIN HFA) 108 (90 BASE) MCG/ACT inhaler Inhale 2 puffs into the lungs every 6 (six) hours as needed for wheezing or shortness of breath. 1 Inhaler 3  . Ascorbic Acid (VITAMIN C PO) Take  by mouth.    . dexlansoprazole (DEXILANT) 60 MG capsule Take 1 capsule (60 mg total) by mouth daily. 90 capsule 2  . fluticasone (FLONASE) 50 MCG/ACT nasal spray Place 2 sprays into both nostrils daily. 16 g 6  . Glucosamine 500 MG CAPS Take by mouth.    . levocetirizine (XYZAL) 5 MG tablet Take 1 tablet (5 mg total) by mouth every evening. 30 tablet 5  . oxyCODONE-acetaminophen (PERCOCET/ROXICET) 5-325 MG tablet Take 1 tablet by mouth every 6 (six) hours as needed for severe pain. 15 tablet 0  . VITAMIN D, ERGOCALCIFEROL, PO Take by mouth.    . cyclobenzaprine (FLEXERIL) 10 MG tablet Take 1/2 or one whole tablet bid for muscle spasms 30 tablet 0  . escitalopram (LEXAPRO) 20 MG tablet Take 1 tablet (20 mg total) by mouth daily. 30 tablet 2  . lisinopril-hydrochlorothiazide (PRINZIDE,ZESTORETIC) 10-12.5 MG tablet Take 1 tablet by mouth daily. 90 tablet 3  . valACYclovir (VALTREX) 500 MG tablet Take 1 tablet (500 mg total) by mouth 2 (two) times daily. 20 tablet 5  . buPROPion  (WELLBUTRIN XL) 150 MG 24 hr tablet 1 po qd (Patient not taking: Reported on 05/06/2017) 90 tablet 3  . Calcium Citrate-Vitamin D (CITRACAL/VITAMIN D) 250-200 MG-UNIT TABS Take 2 tablets by mouth daily.    . Coenzyme Q10 (COQ-10 PO) Take by mouth.    Marland Kitchen HYDROcodone-acetaminophen (NORCO) 5-325 MG tablet Take 1 tablet by mouth every 6 (six) hours as needed for moderate pain. (Patient not taking: Reported on 05/06/2017) 20 tablet 0  . methylPREDNISolone (MEDROL DOSEPAK) 4 MG TBPK tablet Take as directed on packet. (Patient not taking: Reported on 05/06/2017) 21 tablet 0  . NADH-ASCORBIC ACID-SOD BICARB PO Take by mouth.     No facility-administered medications prior to visit.     No Known Allergies  Review of Systems  Constitutional: Negative for chills, fever and malaise/fatigue.  HENT: Negative for congestion and hearing loss.   Eyes: Negative for discharge.  Respiratory: Negative for cough, sputum production and shortness of breath.   Cardiovascular: Negative for chest pain, palpitations and leg swelling.  Gastrointestinal: Positive for constipation. Negative for abdominal pain, blood in stool, diarrhea, heartburn, nausea and vomiting.  Genitourinary: Negative for dysuria, frequency, hematuria and urgency.  Musculoskeletal: Negative for back pain, falls and myalgias.  Skin: Negative for rash.  Neurological: Negative for dizziness, sensory change, loss of consciousness, weakness and headaches.  Endo/Heme/Allergies: Negative for environmental allergies. Does not bruise/bleed easily.  Psychiatric/Behavioral: Negative for depression and suicidal ideas. The patient is not nervous/anxious and does not have insomnia.        Objective:    Physical Exam  Constitutional: She is oriented to person, place, and time. She appears well-developed and well-nourished. No distress.  HENT:  Right Ear: External ear normal.  Left Ear: External ear normal.  Nose: Nose normal.  Mouth/Throat: Oropharynx is  clear and moist.  Eyes: Pupils are equal, round, and reactive to light. EOM are normal.  Neck: Normal range of motion. Neck supple.  Cardiovascular: Normal rate, regular rhythm and normal heart sounds.  No murmur heard. Pulmonary/Chest: Effort normal and breath sounds normal. No respiratory distress. She has no wheezes. She has no rales. She exhibits no tenderness.  Abdominal: There is tenderness.  Neurological: She is alert and oriented to person, place, and time.  Psychiatric: She has a normal mood and affect. Her behavior is normal. Judgment and thought content normal.  Nursing note and vitals reviewed.  BP 128/62 (BP Location: Left Arm, Patient Position: Sitting, Cuff Size: Large)   Pulse 79   Temp 98.1 F (36.7 C) (Oral)   Resp 16   Ht _0  (1.626 m)   Wt 191 lb 3.2 oz (86.7 kg)   SpO2 96%   BMI 32.82 kg/m  Wt Readings from Last 3 Encounters:  05/06/17 191 lb 3.2 oz (86.7 kg)  01/31/17 180 lb (81.6 kg)  09/18/16 187 lb (84.8 kg)   BP Readings from Last 3 Encounters:  05/06/17 128/62  01/31/17 132/75  09/18/16 124/88     Immunization History  Administered Date(s) Administered  . Influenza,inj,Quad PF,6+ Mos 03/11/2014, 05/06/2017    Health Maintenance  Topic Date Due  . Hepatitis C Screening  09/27/1955  . HIV Screening  02/16/1972  . COLONOSCOPY  02/16/2007  . PAP SMEAR  03/25/2017  . MAMMOGRAM  09/08/2018  . TETANUS/TDAP  02/16/2019  . INFLUENZA VACCINE  Completed    Lab Results  Component Value Date   WBC 7.3 09/18/2016   HGB 13.8 09/18/2016   HCT 40.3 09/18/2016   PLT 279 09/18/2016   GLUCOSE 87 09/18/2016   CHOL 221 (H) 10/06/2015   TRIG 95.0 10/06/2015   HDL 60.10 10/06/2015   LDLCALC 142 (H) 10/06/2015   ALT 29 10/06/2015   AST 22 10/06/2015   NA 136 09/18/2016   K 3.8 09/18/2016   CL 101 09/18/2016   CREATININE 0.93 09/18/2016   BUN 21 (H) 09/18/2016   CO2 25 09/18/2016   TSH 1.88 10/06/2015    Lab Results  Component Value Date    TSH 1.88 10/06/2015   Lab Results  Component Value Date   WBC 7.3 09/18/2016   HGB 13.8 09/18/2016   HCT 40.3 09/18/2016   MCV 85.2 09/18/2016   PLT 279 09/18/2016   Lab Results  Component Value Date   NA 136 09/18/2016   K 3.8 09/18/2016   CO2 25 09/18/2016   GLUCOSE 87 09/18/2016   BUN 21 (H) 09/18/2016   CREATININE 0.93 09/18/2016   BILITOT 0.5 10/06/2015   ALKPHOS 126 (H) 10/06/2015   AST 22 10/06/2015   ALT 29 10/06/2015   PROT 6.8 10/06/2015   ALBUMIN 4.3 10/06/2015   CALCIUM 9.0 09/18/2016   ANIONGAP 10 09/18/2016   GFR 81.65 10/06/2015   Lab Results  Component Value Date   CHOL 221 (H) 10/06/2015   Lab Results  Component Value Date   HDL 60.10 10/06/2015   Lab Results  Component Value Date   LDLCALC 142 (H) 10/06/2015   Lab Results  Component Value Date   TRIG 95.0 10/06/2015   Lab Results  Component Value Date   CHOLHDL 4 10/06/2015   No results found for: HGBA1C       Assessment & Plan:   Problem List Items Addressed This Visit      Unprioritized   HTN (hypertension)    Well controlled, no changes to meds. Encouraged heart healthy diet such as the DASH diet and exercise as tolerated.       Relevant Medications   lisinopril-hydrochlorothiazide (PRINZIDE,ZESTORETIC) 10-12.5 MG tablet   Other Relevant Orders   POCT Urinalysis Dipstick (Automated) (Completed)   Comp Met (CMET)   Lipid panel   TSH   Preventative health care    ghm utd Check labs See AVS      Relevant Orders   POCT Urinalysis Dipstick (Automated) (Completed)   Comp Met (CMET)   Lipid panel   TSH  Other Visit Diagnoses    Cervical cancer screening    -  Primary   Relevant Orders   Cytology - PAP   Cold sore       Relevant Medications   valACYclovir (VALTREX) 500 MG tablet   Depression, unspecified depression type       Relevant Medications   escitalopram (LEXAPRO) 20 MG tablet   Muscle spasm       Relevant Medications   cyclobenzaprine (FLEXERIL) 10 MG  tablet   Need for hepatitis C screening test       Relevant Orders   Hepatitis C antibody   Irritable bowel syndrome with constipation       Relevant Medications   linaclotide (LINZESS) 290 MCG CAPS capsule   Other Relevant Orders   Ambulatory referral to Gastroenterology   Needs flu shot       Relevant Orders   Flu Vaccine QUAD 6+ mos PF IM (Fluarix Quad PF) (Completed)   Hematuria, unspecified type       Relevant Orders   Urine Culture   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 I am also having her maintain her Calcium Citrate-Vitamin D, albuterol, Glucosamine, NADH-ASCORBIC ACID-SOD BICARB PO, Coenzyme Q10 (COQ-10 PO), Ascorbic Acid (VITAMIN C PO), (VITAMIN D, ERGOCALCIFEROL, PO), HYDROcodone-acetaminophen, buPROPion, levocetirizine, fluticasone, oxyCODONE-acetaminophen, methylPREDNISolone, dexlansoprazole, valACYclovir, lisinopril-hydrochlorothiazide, escitalopram, and cyclobenzaprine.  Meds ordered this encounter  Medications  . valACYclovir (VALTREX) 500 MG tablet    Sig: Take 1 tablet (500 mg total) by mouth 2 (two) times daily.    Dispense:  20 tablet    Refill:  5  . lisinopril-hydrochlorothiazide (PRINZIDE,ZESTORETIC) 10-12.5 MG tablet    Sig: Take 1 tablet by mouth daily.    Dispense:  90 tablet    Refill:  3  . escitalopram (LEXAPRO) 20 MG tablet    Sig: Take 1 tablet (20 mg total) by mouth daily.    Dispense:  30 tablet    Refill:  2  . cyclobenzaprine (FLEXERIL) 10 MG tablet    Sig: Take 1/2 or one whole tablet bid for muscle spasms    Dispense:  30 tablet    Refill:  0  . linaclotide (LINZESS) 290 MCG CAPS capsule    Sig: Take 1 capsule (290 mcg total) by mouth daily before breakfast.    Dispense:  30 capsule    Refill:  2    CMA served as scribe during this visit. History, Physical and Plan performed by medical provider. Documentation and orders reviewed and  attested to.  Ann Held, DO

## 2017-05-06 NOTE — Telephone Encounter (Signed)
PA approved through 05/07/2018.

## 2017-05-07 DIAGNOSIS — Z0000 Encounter for general adult medical examination without abnormal findings: Secondary | ICD-10-CM | POA: Insufficient documentation

## 2017-05-07 LAB — URINE CULTURE
MICRO NUMBER:: 90363148
Result:: NO GROWTH
SPECIMEN QUALITY: ADEQUATE

## 2017-05-07 NOTE — Assessment & Plan Note (Signed)
ghm utd Check labs See AVS 

## 2017-05-07 NOTE — Assessment & Plan Note (Signed)
Well controlled, no changes to meds. Encouraged heart healthy diet such as the DASH diet and exercise as tolerated.  °

## 2017-05-08 LAB — COMPREHENSIVE METABOLIC PANEL
AG RATIO: 1.8 (calc) (ref 1.0–2.5)
ALT: 26 U/L (ref 6–29)
AST: 24 U/L (ref 10–35)
Albumin: 4.2 g/dL (ref 3.6–5.1)
Alkaline phosphatase (APISO): 97 U/L (ref 33–130)
BUN: 21 mg/dL (ref 7–25)
CALCIUM: 10 mg/dL (ref 8.6–10.4)
CHLORIDE: 102 mmol/L (ref 98–110)
CO2: 26 mmol/L (ref 20–32)
Creat: 0.84 mg/dL (ref 0.50–0.99)
GLOBULIN: 2.3 g/dL (ref 1.9–3.7)
Glucose, Bld: 94 mg/dL (ref 65–99)
Potassium: 4.2 mmol/L (ref 3.5–5.3)
Sodium: 138 mmol/L (ref 135–146)
Total Bilirubin: 0.5 mg/dL (ref 0.2–1.2)
Total Protein: 6.5 g/dL (ref 6.1–8.1)

## 2017-05-08 LAB — LIPID PANEL
Cholesterol: 206 mg/dL — ABNORMAL HIGH (ref <200)
HDL: 58 mg/dL (ref >50)
LDL Cholesterol (Calc): 124 mg/dL (calc) — ABNORMAL HIGH
NON-HDL CHOLESTEROL (CALC): 148 mg/dL — AB (ref <130)
TRIGLYCERIDES: 128 mg/dL (ref <150)
Total CHOL/HDL Ratio: 3.6 (calc) (ref <5.0)

## 2017-05-08 LAB — HEPATITIS C ANTIBODY
Hepatitis C Ab: NONREACTIVE
SIGNAL TO CUT-OFF: 0.03 (ref <1.00)

## 2017-05-08 LAB — TSH: TSH: 2.45 m[IU]/L (ref 0.40–4.50)

## 2017-05-08 LAB — EXTRA LAV TOP TUBE

## 2017-05-10 LAB — CYTOLOGY - PAP
Diagnosis: NEGATIVE
HPV: NOT DETECTED

## 2017-05-10 NOTE — Addendum Note (Signed)
Addended by: Bartholome Bill on: 05/10/2017 02:40 PM   Modules accepted: Orders

## 2017-06-02 ENCOUNTER — Ambulatory Visit: Payer: 59 | Admitting: Medical

## 2017-06-02 ENCOUNTER — Encounter: Payer: Self-pay | Admitting: Medical

## 2017-06-02 VITALS — BP 122/76 | HR 98 | Temp 98.5°F | Resp 16 | Ht 64.0 in | Wt 192.0 lb

## 2017-06-02 DIAGNOSIS — R062 Wheezing: Secondary | ICD-10-CM | POA: Diagnosis not present

## 2017-06-02 DIAGNOSIS — J069 Acute upper respiratory infection, unspecified: Secondary | ICD-10-CM

## 2017-06-02 DIAGNOSIS — H6690 Otitis media, unspecified, unspecified ear: Secondary | ICD-10-CM | POA: Diagnosis not present

## 2017-06-02 DIAGNOSIS — J0100 Acute maxillary sinusitis, unspecified: Secondary | ICD-10-CM | POA: Diagnosis not present

## 2017-06-02 DIAGNOSIS — J301 Allergic rhinitis due to pollen: Secondary | ICD-10-CM

## 2017-06-02 MED ORDER — FLUTICASONE PROPIONATE 50 MCG/ACT NA SUSP: 2.0000 | Freq: Every day | NASAL | 1 refills | Status: DC

## 2017-06-02 MED ORDER — LEVOCETIRIZINE DIHYDROCHLORIDE 5 MG PO TABS: 5.0000 mg | ORAL_TABLET | Freq: Every evening | ORAL | 0 refills | Status: DC

## 2017-06-02 MED ORDER — HYDROCODONE-HOMATROPINE 5-1.5 MG/5ML PO SYRP: 5.0000 mL | ORAL_SOLUTION | Freq: Four times a day (QID) | ORAL | 0 refills | Status: DC | PRN

## 2017-06-02 MED ORDER — ALBUTEROL SULFATE (2.5 MG/3ML) 0.083% IN NEBU: 2.5000 mg | INHALATION_SOLUTION | Freq: Four times a day (QID) | RESPIRATORY_TRACT | 12 refills | Status: DC | PRN

## 2017-06-02 MED ORDER — FLUTICASONE PROPIONATE HFA 110 MCG/ACT IN AERO: 2.0000 | INHALATION_SPRAY | Freq: Two times a day (BID) | RESPIRATORY_TRACT | 2 refills | Status: DC

## 2017-06-02 MED ORDER — AMOXICILLIN-POT CLAVULANATE 875-125 MG PO TABS: 1.0000 | ORAL_TABLET | Freq: Two times a day (BID) | ORAL | 0 refills | Status: DC

## 2017-06-02 NOTE — Patient Instructions (Signed)
You do appear to have allergic rhinitis flare recently with left ear infection, sinusitis and some wheezing.  For allergic rhinitis, I prescribed Xyzal antihistamine and Flonase nasal spray.  For left ear infection and sinus infection, I prescribed Augmentin antibiotic.  For the severe cough preventing you from sleeping, I prescribed Hycodan.  Common side effect is sedation.  For recent wheezing I refilled your albuterol solution.  If you find that you are having to use albuterol every 6 hours then recommend starting Flovent as well.  If above signs and symptoms persist particularly wheezing or chest congestion then recommend chest x-ray next week.  If worse/severe signs and symptoms over the long holiday weekend then recommend ED evaluation.  Follow-up in 7 to 10 days or as needed.

## 2017-06-02 NOTE — Progress Notes (Signed)
Subjective:    Patient ID: Lacey Johns, female    DOB: April 20, 1955, 62 y.o.   MRN: [id number]  HPI  Pt in for recent nasal congestion, runny nose, hoarse voice, transient light headed and mild sore throat.(symptoms for past week)  Some chills. Some sweats but menopausal. No documented fever.  Pt does have sinus pain recently and left ear pain  Some wheezing since yesterday afternoon.  No hx of smoking.  Review of Systems  Constitutional: Positive for chills and diaphoresis. Negative for fatigue and fever.       But menopausal  HENT: Positive for congestion, postnasal drip, rhinorrhea, sinus pressure, sneezing and sore throat. Negative for ear pain.   Respiratory: Positive for cough and wheezing. Negative for shortness of breath.        Cough keeping her up at night.  Cardiovascular: Negative for chest pain and palpitations.  Gastrointestinal: Negative for abdominal distention, abdominal pain and anal bleeding.  Musculoskeletal: Negative for back pain and myalgias.  Neurological: Negative for dizziness, seizures, syncope, weakness, numbness and headaches.       Transient light headed.one second or so very random then resolves.  Hematological: Negative for adenopathy. Does not bruise/bleed easily.  Psychiatric/Behavioral: Negative for behavioral problems and confusion. The patient is not nervous/anxious.    Past Medical History:  Diagnosis Date  . Abnormal mammogram of right breast   . Abnormal Pap smear of cervix   . Ankle fracture, right   . Asthma   . Carpal tunnel syndrome, bilateral   . Diabetes mellitus without complication ([place])   . GERD (gastroesophageal reflux disease)   . Hypertension      Social History   Socioeconomic History  . Marital status: Married    Spouse name: Not on file  . Number of children: Not on file  . Years of education: Not on file  . Highest education level: Not on file  Occupational History  . Not on file  Social Needs  . Financial  resource strain: Not on file  . Food insecurity:    Worry: Not on file    Inability: Not on file  . Transportation needs:    Medical: Not on file    Non-medical: Not on file  Tobacco Use  . Smoking status: Never Smoker  . Smokeless tobacco: Never Used  Substance and Sexual Activity  . Alcohol use: No    Alcohol/week: 0.0 oz  . Drug use: No  . Sexual activity: Yes    Partners: Male  Lifestyle  . Physical activity:    Days per week: Not on file    Minutes per session: Not on file  . Stress: Not on file  Relationships  . Social connections:    Talks on phone: Not on file    Gets together: Not on file    Attends religious service: Not on file    Active member of club or organization: Not on file    Attends meetings of clubs or organizations: Not on file    Relationship status: Not on file  . Intimate partner violence:    Fear of current or ex partner: Not on file    Emotionally abused: Not on file    Physically abused: Not on file    Forced sexual activity: Not on file  Other Topics Concern  . Not on file  Social History Narrative  . Not on file    Past Surgical History:  Procedure Laterality Date  . CERVICAL CONE BIOPSY    .  NASAL SEPTUM SURGERY    . NECK SURGERY    . OVARIAN CYST REMOVAL    . TONSILLECTOMY    . WRIST SURGERY      Family History  Problem Relation Age of Onset  . Hypertension Mother   . Hypertension Father   . Hyperlipidemia Father   . Diabetes Father   . Breast cancer Maternal Aunt   . ALS Maternal Aunt   . Parkinson's disease Maternal Aunt     No Known Allergies  Current Outpatient Medications on File Prior to Visit  Medication Sig Dispense Refill  . albuterol (PROVENTIL HFA;VENTOLIN HFA) 108 (90 BASE) MCG/ACT inhaler Inhale 2 puffs into the lungs every 6 (six) hours as needed for wheezing or shortness of breath. 1 Inhaler 3  . Ascorbic Acid (VITAMIN C PO) Take by mouth.    Marland Kitchen buPROPion (WELLBUTRIN XL) 150 MG 24 hr tablet 1 po qd 90  tablet 3  . Calcium Citrate-Vitamin D (CITRACAL/VITAMIN D) 250-200 MG-UNIT TABS Take 2 tablets by mouth daily.    . Coenzyme Q10 (COQ-10 PO) Take by mouth.    . cyclobenzaprine (FLEXERIL) 10 MG tablet Take 1/2 or one whole tablet bid for muscle spasms 30 tablet 0  . dexlansoprazole (DEXILANT) 60 MG capsule Take 1 capsule (60 mg total) by mouth daily. 90 capsule 2  . escitalopram (LEXAPRO) 20 MG tablet Take 1 tablet (20 mg total) by mouth daily. 30 tablet 2  . fluticasone (FLONASE) 50 MCG/ACT nasal spray Place 2 sprays into both nostrils daily. 16 g 6  . Glucosamine 500 MG CAPS Take by mouth.    Marland Kitchen HYDROcodone-acetaminophen (NORCO) 5-325 MG tablet Take 1 tablet by mouth every 6 (six) hours as needed for moderate pain. 20 tablet 0  . levocetirizine (XYZAL) 5 MG tablet Take 1 tablet (5 mg total) by mouth every evening. 30 tablet 5  . linaclotide (LINZESS) 290 MCG CAPS capsule Take 1 capsule (290 mcg total) by mouth daily before breakfast. 30 capsule 2  . lisinopril-hydrochlorothiazide (PRINZIDE,ZESTORETIC) 10-12.5 MG tablet Take 1 tablet by mouth daily. 90 tablet 3  . methylPREDNISolone (MEDROL DOSEPAK) 4 MG TBPK tablet Take as directed on packet. 21 tablet 0  . NADH-ASCORBIC ACID-SOD BICARB PO Take by mouth.    . oxyCODONE-acetaminophen (PERCOCET/ROXICET) 5-325 MG tablet Take 1 tablet by mouth every 6 (six) hours as needed for severe pain. 15 tablet 0  . valACYclovir (VALTREX) 500 MG tablet Take 1 tablet (500 mg total) by mouth 2 (two) times daily. 20 tablet 5  . VITAMIN D, ERGOCALCIFEROL, PO Take by mouth.     No current facility-administered medications on file prior to visit.     BP 122/76 (BP Location: Left Arm, Patient Position: Sitting, Cuff Size: Normal)   Pulse 98   Temp 98.5 F (36.9 C) (Oral)   Resp 16   Ht 5\' 4"  (1.626 m)   Wt 192 lb (87.1 kg)   SpO2 97%   BMI 32.96 kg/m      Objective:   Physical Exam   General  Mental Status - Alert. General Appearance - Well groomed.  Not in acute distress.  Skin Rashes- No Rashes.  HEENT Head- Normal. Ear Auditory Canal - Left- Normal. Right - Normal.Tympanic Membrane- Left-moderate bright red TM right- Normal. Eye Sclera/Conjunctiva- Left- Normal. Right- Normal. Nose & Sinuses Nasal Mucosa- Left-  Boggy and Congested. Right-  Boggy and  Congested.Bilateral maxillary and frontal sinus pressure. Mouth & Throat Lips: Upper Lip- Normal: no dryness, cracking,  pallor, cyanosis, or vesicular eruption. Lower Lip-Normal: no dryness, cracking, pallor, cyanosis or vesicular eruption. Buccal Mucosa- Bilateral- No Aphthous ulcers. Oropharynx- No Discharge or Erythema. Tonsils: Characteristics- Bilateral- No Erythema or Congestion. Size/Enlargement- Bilateral- No enlargement. Discharge- bilateral-None.  Neck Neck- Supple. No Masses.   Chest and Lung Exam Auscultation: Breath Sounds:-Clear even and unlabored.  Cardiovascular Auscultation:Rythm- Regular, rate and rhythm. Murmurs & Other Heart Sounds:Ausculatation of the heart reveal- No Murmurs.  Lymphatic Head & Neck General Head & Neck Lymphatics: Bilateral: Description- No Localized lymphadenopathy.     Assessment & Plan:  You do appear to have allergic rhinitis flare recently with left ear infection, sinusitis and some wheezing.  For allergic rhinitis, I prescribed Xyzal antihistamine and Flonase nasal spray.  For left ear infection and sinus infection, I prescribed Augmentin antibiotic.  For the severe cough preventing you from sleeping, I prescribed Hycodan.  Common side effect is sedation.  For recent wheezing I refilled your albuterol solution.  If you find that you are having to use albuterol every 6 hours then recommend starting Flovent as well.  If above signs and symptoms persist particularly wheezing or chest congestion then recommend chest x-ray next week.  If worse/severe signs and symptoms over the long holiday weekend then recommend ED  evaluation.  Follow-up in 7 to 10 days or as needed.  Mackie Pai, PA-C

## 2017-06-06 ENCOUNTER — Ambulatory Visit: Payer: Self-pay | Admitting: *Deleted

## 2017-06-06 ENCOUNTER — Emergency Department (HOSPITAL_BASED_OUTPATIENT_CLINIC_OR_DEPARTMENT_OTHER): Payer: 59

## 2017-06-06 ENCOUNTER — Emergency Department (HOSPITAL_BASED_OUTPATIENT_CLINIC_OR_DEPARTMENT_OTHER)
Admission: EM | Admit: 2017-06-06 | Discharge: 2017-06-07 | Disposition: A | Discharge status: Home / Self Care | Payer: 59 | Attending: Emergency Medicine | Admitting: Emergency Medicine

## 2017-06-06 ENCOUNTER — Other Ambulatory Visit: Payer: Self-pay

## 2017-06-06 ENCOUNTER — Encounter: Payer: Self-pay | Admitting: Gastroenterology

## 2017-06-06 ENCOUNTER — Encounter (HOSPITAL_BASED_OUTPATIENT_CLINIC_OR_DEPARTMENT_OTHER): Payer: Self-pay | Admitting: *Deleted

## 2017-06-06 DIAGNOSIS — Z79899 Other long term (current) drug therapy: Secondary | ICD-10-CM | POA: Insufficient documentation

## 2017-06-06 DIAGNOSIS — E119 Type 2 diabetes mellitus without complications: Secondary | ICD-10-CM | POA: Diagnosis not present

## 2017-06-06 DIAGNOSIS — R51 Headache: Secondary | ICD-10-CM | POA: Diagnosis not present

## 2017-06-06 DIAGNOSIS — R42 Dizziness and giddiness: Secondary | ICD-10-CM | POA: Diagnosis not present

## 2017-06-06 DIAGNOSIS — I10 Essential (primary) hypertension: Secondary | ICD-10-CM | POA: Diagnosis not present

## 2017-06-06 DIAGNOSIS — R531 Weakness: Secondary | ICD-10-CM | POA: Insufficient documentation

## 2017-06-06 DIAGNOSIS — M5442 Lumbago with sciatica, left side: Secondary | ICD-10-CM | POA: Diagnosis not present

## 2017-06-06 DIAGNOSIS — J45909 Unspecified asthma, uncomplicated: Secondary | ICD-10-CM | POA: Insufficient documentation

## 2017-06-06 DIAGNOSIS — K219 Gastro-esophageal reflux disease without esophagitis: Secondary | ICD-10-CM | POA: Diagnosis not present

## 2017-06-06 DIAGNOSIS — M545 Low back pain: Secondary | ICD-10-CM | POA: Diagnosis present

## 2017-06-06 DIAGNOSIS — Z8541 Personal history of malignant neoplasm of cervix uteri: Secondary | ICD-10-CM | POA: Diagnosis not present

## 2017-06-06 DIAGNOSIS — K7689 Other specified diseases of liver: Secondary | ICD-10-CM | POA: Diagnosis not present

## 2017-06-06 DIAGNOSIS — R200 Anesthesia of skin: Secondary | ICD-10-CM | POA: Diagnosis not present

## 2017-06-06 DIAGNOSIS — M5441 Lumbago with sciatica, right side: Secondary | ICD-10-CM | POA: Diagnosis not present

## 2017-06-06 LAB — URINALYSIS, ROUTINE W REFLEX MICROSCOPIC
BILIRUBIN URINE: NEGATIVE
Glucose, UA: NEGATIVE mg/dL
Ketones, ur: NEGATIVE mg/dL
Leukocytes, UA: NEGATIVE
Nitrite: NEGATIVE
PH: 7 (ref 5.0–8.0)
Protein, ur: NEGATIVE mg/dL
SPECIFIC GRAVITY, URINE: 1.015 (ref 1.005–1.030)

## 2017-06-06 LAB — BASIC METABOLIC PANEL
Anion gap: 8 (ref 5–15)
BUN: 12 mg/dL (ref 6–20)
CALCIUM: 8.3 mg/dL — AB (ref 8.9–10.3)
CO2: 22 mmol/L (ref 22–32)
CREATININE: 0.77 mg/dL (ref 0.44–1.00)
Chloride: 106 mmol/L (ref 101–111)
GLUCOSE: 111 mg/dL — AB (ref 65–99)
Potassium: 3.9 mmol/L (ref 3.5–5.1)
Sodium: 136 mmol/L (ref 135–145)

## 2017-06-06 LAB — CBC WITH DIFFERENTIAL/PLATELET
BASOS PCT: 1 %
Basophils Absolute: 0.1 10*3/uL (ref 0.0–0.1)
Eosinophils Absolute: 0.4 10*3/uL (ref 0.0–0.7)
Eosinophils Relative: 4 %
HEMATOCRIT: 41.2 % (ref 36.0–46.0)
Hemoglobin: 14.6 g/dL (ref 12.0–15.0)
Lymphocytes Relative: 40 %
Lymphs Abs: 3.5 10*3/uL (ref 0.7–4.0)
MCH: 30.9 pg (ref 26.0–34.0)
MCHC: 35.4 g/dL (ref 30.0–36.0)
MCV: 87.3 fL (ref 78.0–100.0)
MONO ABS: 0.6 10*3/uL (ref 0.1–1.0)
MONOS PCT: 7 %
NEUTROS ABS: 4.3 10*3/uL (ref 1.7–7.7)
Neutrophils Relative %: 48 %
PLATELETS: 306 10*3/uL (ref 150–400)
RBC: 4.72 MIL/uL (ref 3.87–5.11)
RDW: 13.7 % (ref 11.5–15.5)
WBC: 9 10*3/uL (ref 4.0–10.5)

## 2017-06-06 LAB — URINALYSIS, MICROSCOPIC (REFLEX): WBC, UA: NONE SEEN WBC/hpf (ref 0–5)

## 2017-06-06 MED ORDER — HYDROCODONE-ACETAMINOPHEN 5-325 MG PO TABS: 1.0000 | ORAL_TABLET | Freq: Four times a day (QID) | ORAL | 0 refills | Status: DC | PRN

## 2017-06-06 MED ORDER — KETOROLAC TROMETHAMINE 15 MG/ML IJ SOLN
15.0000 mg | Freq: Once | INTRAMUSCULAR | Status: AC
  Administered 2017-06-06: 15 mg via INTRAVENOUS
  Filled 2017-06-06: qty 1

## 2017-06-06 MED ORDER — SODIUM CHLORIDE 0.9 % IV BOLUS
1000.0000 mL | Freq: Once | INTRAVENOUS | Status: AC
  Administered 2017-06-06: 1000 mL via INTRAVENOUS

## 2017-06-06 MED ORDER — HYDROMORPHONE HCL 1 MG/ML IJ SOLN
1.0000 mg | Freq: Once | INTRAMUSCULAR | Status: AC
  Administered 2017-06-06: 1 mg via INTRAVENOUS
  Filled 2017-06-06: qty 1

## 2017-06-06 MED ORDER — HYDROCODONE-ACETAMINOPHEN 5-325 MG PO TABS
2.0000 | ORAL_TABLET | Freq: Once | ORAL | Status: AC
  Administered 2017-06-06: 2 via ORAL
  Filled 2017-06-06: qty 2

## 2017-06-06 NOTE — Telephone Encounter (Signed)
Pt calling with complaints of unbearable lower back cramping that goes down both legs. Pt states the sensation is in the back of her thighs and states that the pain feels like "labor pain". Pt states the pain started around lunch time today and has increased in severity. Pt is currently driving in her car and states that she was now turning on to [place]. Pt advised to seek treatment in the ED due to severe pain. Pt verbalized understanding.   Reason for Disposition . [1] SEVERE back pain (e.g., excruciating) AND [2] sudden onset AND [3] age > 60  Protocols used: BACK PAIN-A-AH

## 2017-06-06 NOTE — ED Triage Notes (Signed)
Lower back pain all day. She has a hx of degenerative disc and has back pain sometimes but this pain is worse than her usual back pain.

## 2017-06-06 NOTE — ED Provider Notes (Signed)
[facility] EMERGENCY DEPARTMENT Provider Note   CSN: [id number] Arrival date & time: 06/06/17  1757     History   Chief Complaint Chief Complaint  Patient presents with  . Back Pain    HPI Lacey Johns is a 62 y.o. female.  HPI  62 year old female with a history of chronic pain chronic neck and upper back pain status post surgery presents with acute low back pain.  It is all across her entire low back.  She has not been feeling well for about a week with generalized weakness and some dizziness.  This was attributed to a sinus infection and she is currently on antibiotics.  However the sinus symptoms are still present.  There is no chest pain, abdominal pain, shortness of breath.  There was no injury.  The back pain started all of a sudden this afternoon and has rapidly gotten worse.  She has not taken anything for it.  The pain does seem to sometimes radiate down her posterior thighs and a little bit into her calves.  Some tingling in her toes.  However no weakness in her lower extremities and no bowel or bladder incontinence.  No saddle anesthesia.  Pain is severe.  She feels like she cannot find a comfortable position.  Past Medical History:  Diagnosis Date  . Abnormal mammogram of right breast   . Abnormal Pap smear of cervix   . Ankle fracture, right   . Asthma   . Carpal tunnel syndrome, bilateral   . Diabetes mellitus without complication ([place])   . GERD (gastroesophageal reflux disease)   . Hypertension     Patient Active Problem List   Diagnosis Date Noted  . Preventative health care 05/07/2017  . Chronic pain disorder 11/08/2016  . Acute upper respiratory infection 07/28/2016  . De Quervain's tenosynovitis, right 03/01/2016  . HTN (hypertension) 08/15/2013  . Irritability 08/15/2013  . Adjustment disorder with anxious mood 08/15/2013  . FH: breast cancer in first degree relative  Mother , GM, maunt 08/15/2013  . Cervical cancer  pt reports CA in situ  S/P conization college age 82/02/2013  . GERD (gastroesophageal reflux disease) 08/15/2013  . Exercise-induced asthma 08/15/2013  . Carpal tunnel syndrome 08/15/2013  . Right ankle injury 04/04/2013  . Right foot injury 04/04/2013    Past Surgical History:  Procedure Laterality Date  . CERVICAL CONE BIOPSY    . NASAL SEPTUM SURGERY    . NECK SURGERY    . OVARIAN CYST REMOVAL    . TONSILLECTOMY    . WRIST SURGERY       OB History    Gravida  3   Para      Term      Preterm      AB      Living  3     SAB      TAB      Ectopic      Multiple      Live Births               Home Medications    Prior to Admission medications   Medication Sig Start Date End Date Taking? Authorizing Provider  albuterol (PROVENTIL HFA;VENTOLIN HFA) 108 (90 BASE) MCG/ACT inhaler Inhale 2 puffs into the lungs every 6 (six) hours as needed for wheezing or shortness of breath. 09/16/14   Roma Schanz R, DO  albuterol (PROVENTIL) (2.5 MG/3ML) 0.083% nebulizer solution Take 3 mLs (2.5 mg total) by nebulization every  6 (six) hours as needed for wheezing or shortness of breath. 06/02/17   Saguier, Percell Miller, PA-C  amoxicillin-clavulanate (AUGMENTIN) 875-125 MG tablet Take 1 tablet by mouth 2 (two) times daily. 06/02/17   Saguier, Percell Miller, PA-C  Ascorbic Acid (VITAMIN C PO) Take by mouth.    [provider]  buPROPion (WELLBUTRIN XL) 150 MG 24 hr tablet 1 po qd 07/27/16   Carollee Herter, Alferd Apa, DO  Calcium Citrate-Vitamin D (CITRACAL/VITAMIN D) 250-200 MG-UNIT TABS Take 2 tablets by mouth daily.    [provider]  Coenzyme Q10 (COQ-10 PO) Take by mouth.    [provider]  cyclobenzaprine (FLEXERIL) 10 MG tablet Take 1/2 or one whole tablet bid for muscle spasms 05/06/17   Carollee Herter, Alferd Apa, DO  dexlansoprazole (DEXILANT) 60 MG capsule Take 1 capsule (60 mg total) by mouth daily. 10/28/16   Roma Schanz R, DO  escitalopram (LEXAPRO) 20 MG tablet Take 1  tablet (20 mg total) by mouth daily. 05/06/17   Roma Schanz R, DO  fluticasone (FLONASE) 50 MCG/ACT nasal spray Place 2 sprays into both nostrils daily. 07/27/16   Roma Schanz R, DO  fluticasone (FLONASE) 50 MCG/ACT nasal spray Place 2 sprays into both nostrils daily. 06/02/17   Saguier, Percell Miller, PA-C  fluticasone (FLOVENT HFA) 110 MCG/ACT inhaler Inhale 2 puffs into the lungs 2 (two) times daily. 06/02/17   Saguier, Percell Miller, PA-C  Glucosamine 500 MG CAPS Take by mouth.    [provider]  HYDROcodone-acetaminophen (NORCO) 5-325 MG tablet Take 1-2 tablets by mouth every 6 (six) hours as needed for severe pain. 06/06/17   Sherwood Gambler, MD  HYDROcodone-homatropine [facility]) 5-1.5 MG/5ML syrup Take 5 mLs by mouth every 6 (six) hours as needed for cough. 06/02/17   Saguier, Percell Miller, PA-C  levocetirizine (XYZAL) 5 MG tablet Take 1 tablet (5 mg total) by mouth every evening. 07/27/16   Ann Held, DO  levocetirizine (XYZAL) 5 MG tablet Take 1 tablet (5 mg total) by mouth every evening. 06/02/17   Saguier, Percell Miller, PA-C  linaclotide [facility]) 290 MCG CAPS capsule Take 1 capsule (290 mcg total) by mouth daily before breakfast. 05/06/17   Carollee Herter, Alferd Apa, DO  lisinopril-hydrochlorothiazide (PRINZIDE,ZESTORETIC) 10-12.5 MG tablet Take 1 tablet by mouth daily. 05/06/17   Roma Schanz R, DO  methylPREDNISolone (MEDROL DOSEPAK) 4 MG TBPK tablet Take as directed on packet. 09/18/16   McDonald, Mia A, PA-C  NADH-ASCORBIC ACID-SOD BICARB PO Take by mouth.    [provider]  oxyCODONE-acetaminophen (PERCOCET/ROXICET) 5-325 MG tablet Take 1 tablet by mouth every 6 (six) hours as needed for severe pain. 09/18/16   McDonald, Mia A, PA-C  valACYclovir (VALTREX) 500 MG tablet Take 1 tablet (500 mg total) by mouth 2 (two) times daily. 05/06/17   Roma Schanz R, DO  VITAMIN D, ERGOCALCIFEROL, PO Take by mouth.    [provider]    Family History Family History    Problem Relation Age of Onset  . Hypertension Mother   . Hypertension Father   . Hyperlipidemia Father   . Diabetes Father   . Breast cancer Maternal Aunt   . ALS Maternal Aunt   . Parkinson's disease Maternal Aunt     Social History Social History   Tobacco Use  . Smoking status: Never Smoker  . Smokeless tobacco: Never Used  Substance Use Topics  . Alcohol use: No    Alcohol/week: 0.0 oz  . Drug use: No  Allergies   Patient has no known allergies.   Review of Systems Review of Systems  Constitutional: Negative for fever.  Gastrointestinal: Negative for abdominal pain.  Genitourinary: Negative for dysuria and hematuria.  Musculoskeletal: Positive for back pain.  Neurological: Positive for numbness. Negative for weakness.  All other systems reviewed and are negative.    Physical Exam Updated Vital Signs BP 123/82   Pulse (!) 101   Temp 98.4 F (36.9 C) (Oral)   Resp (!) 27   Ht 5\' 4"  (1.626 m)   Wt 87.1 kg (192 lb)   SpO2 96%   BMI 32.96 kg/m   Physical Exam  Constitutional: She is oriented to person, place, and time. She appears well-developed and well-nourished.  Uncomfortable, constantly moving in stretcher  HENT:  Head: Normocephalic and atraumatic.  Right Ear: External ear normal.  Left Ear: External ear normal.  Nose: Nose normal.  Eyes: Right eye exhibits no discharge. Left eye exhibits no discharge.  Cardiovascular: Normal rate, regular rhythm and normal heart sounds.  Pulses:      Dorsalis pedis pulses are 2+ on the right side, and 2+ on the left side.  Pulmonary/Chest: Effort normal and breath sounds normal.  Abdominal: Soft. She exhibits no distension. There is no tenderness.  Musculoskeletal:       Cervical back: She exhibits tenderness (mild).       Thoracic back: She exhibits tenderness (mild).       Lumbar back: She exhibits tenderness and bony tenderness.  Neurological: She is alert and oriented to person, place, and time.   Reflex Scores:      Patellar reflexes are 2+ on the right side and 2+ on the left side.      Achilles reflexes are 2+ on the right side and 2+ on the left side. 5/5 strength in BLE. Normal gross sensation  Skin: Skin is warm and dry.  Nursing note and vitals reviewed.    ED Treatments / Results  Labs (all labs ordered are listed, but only abnormal results are displayed) Labs Reviewed  URINALYSIS, ROUTINE W REFLEX MICROSCOPIC - Abnormal; Notable for the following components:      Result Value   Hgb urine dipstick MODERATE (*)    All other components within normal limits  URINALYSIS, MICROSCOPIC (REFLEX) - Abnormal; Notable for the following components:   Bacteria, UA RARE (*)    Squamous Epithelial / LPF 0-5 (*)    All other components within normal limits  BASIC METABOLIC PANEL - Abnormal; Notable for the following components:   Glucose, Bld 111 (*)    Calcium 8.3 (*)    All other components within normal limits  CBC WITH DIFFERENTIAL/PLATELET    EKG EKG Interpretation  Date/Time:  Monday June 06 2017 20:39:12 EDT Ventricular Rate:  96 PR Interval:    QRS Duration: 108 QT Interval:  385 QTC Calculation: 490 R Axis:   29 Text Interpretation:  Sinus rhythm Low voltage, extremity and precordial leads Minimal ST depression, inferior leads Borderline prolonged QT interval no significant change since Aug 2018 Confirmed by Sherwood Gambler [phone]) on 06/06/2017 9:16:47 PM   Radiology Ct Renal Stone Study  Result Date: 06/06/2017 CLINICAL DATA:  Acute onset of lower back pain radiating to both lower extremities. Headache. EXAM: CT ABDOMEN AND PELVIS WITHOUT CONTRAST TECHNIQUE: Multidetector CT imaging of the abdomen and pelvis was performed following the standard protocol without IV contrast. COMPARISON:  None. FINDINGS: Lower chest: The visualized lung bases are grossly clear. The  visualized portions of the mediastinum are unremarkable. Hepatobiliary: Large hepatic cysts measure  up to 12.2 cm in size. The gallbladder is unremarkable. The common bile duct is normal in caliber. Pancreas: The pancreas is within normal limits. Spleen: The spleen is unremarkable in appearance. Adrenals/Urinary Tract: The adrenal glands are unremarkable in appearance. The kidneys are within normal limits. There is no evidence of hydronephrosis. No renal or ureteral stones are identified. No perinephric stranding is seen. Stomach/Bowel: The stomach is unremarkable in appearance. The small bowel is within normal limits. The appendix is normal in caliber, without evidence of appendicitis. The colon is unremarkable in appearance. Vascular/Lymphatic: The abdominal aorta is unremarkable in appearance. The inferior vena cava is grossly unremarkable. No retroperitoneal lymphadenopathy is seen. No pelvic sidewall lymphadenopathy is identified. Reproductive: The bladder is mildly distended and grossly unremarkable. The uterus is unremarkable in appearance. The ovaries are relatively symmetric. No suspicious adnexal masses are seen. Other: No additional soft tissue abnormalities are seen. Musculoskeletal: No acute osseous abnormalities are identified. Facet disease is noted at the lower lumbar spine. The visualized musculature is unremarkable in appearance. IMPRESSION: 1. No acute abnormality seen within the abdomen or pelvis. 2. Large hepatic cysts measure up to 12.2 cm in size. Electronically Signed   By: Garald Balding M.D.   On: 06/06/2017 21:39    Procedures Procedures (including critical care time)  Medications Ordered in ED Medications  sodium chloride 0.9 % bolus 1,000 mL (0 mLs Intravenous Stopped 06/06/17 2254)  HYDROmorphone (DILAUDID) injection 1 mg (1 mg Intravenous Given 06/06/17 2034)  HYDROmorphone (DILAUDID) injection 1 mg (1 mg Intravenous Given 06/06/17 2209)  ketorolac (TORADOL) 15 MG/ML injection 15 mg (15 mg Intravenous Given 06/06/17 2253)  HYDROcodone-acetaminophen (NORCO/VICODIN) 5-325 MG  per tablet 2 tablet (2 tablets Oral Given 06/06/17 2253)     Initial Impression / Assessment and Plan / ED Course  I have reviewed the triage vital signs and the nursing notes.  Pertinent labs & imaging results that were available during my care of the patient were reviewed by me and considered in my medical decision making (see chart for details).     Given the hematuria seen as well as the patient's very uncomfortable nature with her back pain, CT obtained which shows no acute intra-abdominal emergency.  No renal stone.  Hepatic cyst but I do not think that is causing her symptoms today.  This is likely sciatica but she shows no acute neurologic compromise.  She endorses some radiation of pain but she has normal sensation and strength.  No incontinence.  Now that her pain is controlled, which was best controlled after IV Toradol, she appears stable for discharge home.  Continue use NSAIDs and have hydrocodone for breakthrough pain.  Discharge home with return precautions, follow-up with her neurosurgeon.  Final Clinical Impressions(s) / ED Diagnoses   Final diagnoses:  Acute bilateral low back pain with bilateral sciatica    ED Discharge Orders        Ordered    HYDROcodone-acetaminophen (NORCO) 5-325 MG tablet  Every 6 hours PRN     06/06/17 2329       Sherwood Gambler, MD 06/07/17 0002

## 2017-06-06 NOTE — ED Notes (Signed)
Pt. Crying when RN entered room to assess the pt.   Pt. States she can't get comfortable with the back pain .  Pt. Reports pain in lower back into her buttocks and into the back of each leg now going into the front of her legs.  Pt. Rolling all over the bed.

## 2017-06-08 DIAGNOSIS — M47816 Spondylosis without myelopathy or radiculopathy, lumbar region: Secondary | ICD-10-CM | POA: Diagnosis not present

## 2017-06-08 DIAGNOSIS — M5030 Other cervical disc degeneration, unspecified cervical region: Secondary | ICD-10-CM | POA: Diagnosis not present

## 2017-06-08 DIAGNOSIS — M47812 Spondylosis without myelopathy or radiculopathy, cervical region: Secondary | ICD-10-CM | POA: Diagnosis not present

## 2017-06-20 ENCOUNTER — Encounter: Payer: Self-pay | Admitting: Family Medicine

## 2017-06-20 ENCOUNTER — Ambulatory Visit (INDEPENDENT_AMBULATORY_CARE_PROVIDER_SITE_OTHER): Payer: 59 | Admitting: Family Medicine

## 2017-06-20 DIAGNOSIS — M25531 Pain in right wrist: Secondary | ICD-10-CM | POA: Diagnosis not present

## 2017-06-20 DIAGNOSIS — M5127 Other intervertebral disc displacement, lumbosacral region: Secondary | ICD-10-CM | POA: Diagnosis not present

## 2017-06-20 DIAGNOSIS — M5126 Other intervertebral disc displacement, lumbar region: Secondary | ICD-10-CM | POA: Diagnosis not present

## 2017-06-20 DIAGNOSIS — M47816 Spondylosis without myelopathy or radiculopathy, lumbar region: Secondary | ICD-10-CM | POA: Diagnosis not present

## 2017-06-20 MED ORDER — PREDNISONE 10 MG PO TABS: ORAL_TABLET | ORAL | 0 refills | Status: DC

## 2017-06-20 NOTE — Patient Instructions (Addendum)
We will go ahead with referral to Dr. Amedeo Plenty or Caralyn Guile for your carpal tunnel and dequervain's tenosynovitis as you haven't responded to conservative treatment. Continue with the brace. Take extended course of prednisone as directed. You may need to get a copy of your nerve conduction studies - I can see that they were done in 2017 but cannot see the results of them (by Dr. Rosanne Gutting) - the hand surgeon will want to see them; it's possible they repeat them prior to surgical intervention.

## 2017-06-21 ENCOUNTER — Encounter: Payer: Self-pay | Admitting: Family Medicine

## 2017-06-21 NOTE — Progress Notes (Signed)
PCP: Ann Held, DO  Subjective:   HPI: Patient is a 62 y.o. female here for right hand pain.  1/12: Patient reports she's had 3 days of radial sided wrist pain. No acute injury or trauma. No increase in activity level either. Pain is 7/10, sharp. Worse with motions of thumb. Feels like it will catch at times. Right handed. Has known carpal tunnel as well. No skin changes, numbness.  01/31/17: Patient reports she completely improved following last visit. Then about 1 month ago started to get pain radial side of right wrist. She is on computer a lot at work and also driving. Pain worse with these. Started using brace, icing, trying to rest this. Pain level 9/10 and sharp. No skin changes, numbness but has some localized tingling.  06/20/17: Patient reports for about 2 months she's had fairly severe right hand/wrist pain on radial side. Pain level 10/10 and sharp. Associated numbness into 1st through 3rd digits. Some slight swelling. Worse with picking up things, motions of thumb and wrist. About 2 years ago she had NCVs/EMGs that showed carpal tunnel syndrome but I do not have access to these results, just ortho note saying she had this. She continues to use thumb spica brace. No skin changes, new injuries.  Past Medical History:  Diagnosis Date  . Abnormal mammogram of right breast   . Abnormal Pap smear of cervix   . Ankle fracture, right   . Asthma   . Carpal tunnel syndrome, bilateral   . Diabetes mellitus without complication ([place])   . GERD (gastroesophageal reflux disease)   . Hypertension     Current Outpatient Medications on File Prior to Visit  Medication Sig Dispense Refill  . albuterol (PROVENTIL HFA;VENTOLIN HFA) 108 (90 BASE) MCG/ACT inhaler Inhale 2 puffs into the lungs every 6 (six) hours as needed for wheezing or shortness of breath. 1 Inhaler 3  . Ascorbic Acid (VITAMIN C PO) Take by mouth.    Marland Kitchen buPROPion (WELLBUTRIN XL) 150 MG 24 hr  tablet 1 po qd 90 tablet 3  . Calcium Citrate-Vitamin D (CITRACAL/VITAMIN D) 250-200 MG-UNIT TABS Take 2 tablets by mouth daily.    . Coenzyme Q10 (COQ-10 PO) Take by mouth.    . dexlansoprazole (DEXILANT) 60 MG capsule Take 1 capsule (60 mg total) by mouth daily. 90 capsule 2  . escitalopram (LEXAPRO) 20 MG tablet Take 1 tablet (20 mg total) by mouth daily. 30 tablet 2  . fluticasone (FLONASE) 50 MCG/ACT nasal spray Place 2 sprays into both nostrils daily. 16 g 1  . fluticasone (FLOVENT HFA) 110 MCG/ACT inhaler Inhale 2 puffs into the lungs 2 (two) times daily. 1 Inhaler 2  . Glucosamine 500 MG CAPS Take by mouth.    Marland Kitchen HYDROcodone-acetaminophen (NORCO) 5-325 MG tablet Take 1-2 tablets by mouth every 6 (six) hours as needed for severe pain. 10 tablet 0  . levocetirizine (XYZAL) 5 MG tablet Take 1 tablet (5 mg total) by mouth every evening. 30 tablet 0  . linaclotide (LINZESS) 290 MCG CAPS capsule Take 1 capsule (290 mcg total) by mouth daily before breakfast. 30 capsule 2  . lisinopril-hydrochlorothiazide (PRINZIDE,ZESTORETIC) 10-12.5 MG tablet Take 1 tablet by mouth daily. 90 tablet 3  . NADH-ASCORBIC ACID-SOD BICARB PO Take by mouth.    . oxyCODONE-acetaminophen (PERCOCET/ROXICET) 5-325 MG tablet Take 1 tablet by mouth every 6 (six) hours as needed for severe pain. 15 tablet 0  . valACYclovir (VALTREX) 500 MG tablet Take 1 tablet (500 mg  total) by mouth 2 (two) times daily. 20 tablet 5  . VITAMIN D, ERGOCALCIFEROL, PO Take by mouth.     No current facility-administered medications on file prior to visit.     Past Surgical History:  Procedure Laterality Date  . CERVICAL CONE BIOPSY    . NASAL SEPTUM SURGERY    . NECK SURGERY    . OVARIAN CYST REMOVAL    . TONSILLECTOMY    . WRIST SURGERY      No Known Allergies  Social History   Socioeconomic History  . Marital status: Married    Spouse name: Not on file  . Number of children: Not on file  . Years of education: Not on file  .  Highest education level: Not on file  Occupational History  . Not on file  Social Needs  . Financial resource strain: Not on file  . Food insecurity:    Worry: Not on file    Inability: Not on file  . Transportation needs:    Medical: Not on file    Non-medical: Not on file  Tobacco Use  . Smoking status: Never Smoker  . Smokeless tobacco: Never Used  Substance and Sexual Activity  . Alcohol use: No    Alcohol/week: 0.0 oz  . Drug use: No  . Sexual activity: Yes    Partners: Male  Lifestyle  . Physical activity:    Days per week: Not on file    Minutes per session: Not on file  . Stress: Not on file  Relationships  . Social connections:    Talks on phone: Not on file    Gets together: Not on file    Attends religious service: Not on file    Active member of club or organization: Not on file    Attends meetings of clubs or organizations: Not on file    Relationship status: Not on file  . Intimate partner violence:    Fear of current or ex partner: Not on file    Emotionally abused: Not on file    Physically abused: Not on file    Forced sexual activity: Not on file  Other Topics Concern  . Not on file  Social History Narrative  . Not on file    Family History  Problem Relation Age of Onset  . Hypertension Mother   . Hypertension Father   . Hyperlipidemia Father   . Diabetes Father   . Breast cancer Maternal Aunt   . ALS Maternal Aunt   . Parkinson's disease Maternal Aunt     BP 114/77   Pulse 72   Ht 5\' 4"  (1.626 m)   Wt 188 lb (85.3 kg)   BMI 32.27 kg/m   Review of Systems: See HPI above.     Objective:  Physical Exam:  Gen: NAD, comfortable in exam room  Right hand/wrist: No deformity, swelling, bruising. TTP 1st dorsal compartment, over carpal tunnel.  No other tenderness. FROM digits and wrist with 5/5 strength.  Pain primarily with all thumb motions, especially flexion and extension Sensation diminished 1st-3rd digits. + tinels and  phalens. 2+ radial pulses.  MSK u/s right wrist:  Median nerve cross sectional area 0.15 cm2;  Tenosynovitis confirmed of 1st dorsal compartment.  Assessment & Plan:  1. Right wrist pain - combination of dequervains and carpal tunnel.  She has had two injections for dequervains - first lasted almost a year, most recent only about 2-3 months.  She also has evidence of carpal  tunnel syndrome - prior NCVs and current ultrasound consistent with this diagnosis as well.  She will continue with bracing, try extended prednisone course, and will refer to hand surgery to discuss operative intervention.

## 2017-06-21 NOTE — Assessment & Plan Note (Signed)
combination of dequervains and carpal tunnel.  She has had two injections for dequervains - first lasted almost a year, most recent only about 2-3 months.  She also has evidence of carpal tunnel syndrome - prior NCVs and current ultrasound consistent with this diagnosis as well.  She will continue with bracing, try extended prednisone course, and will refer to hand surgery to discuss operative intervention.

## 2017-06-24 DIAGNOSIS — K253 Acute gastric ulcer without hemorrhage or perforation: Secondary | ICD-10-CM | POA: Diagnosis not present

## 2017-06-24 DIAGNOSIS — M539 Dorsopathy, unspecified: Secondary | ICD-10-CM | POA: Diagnosis not present

## 2017-06-24 DIAGNOSIS — G4730 Sleep apnea, unspecified: Secondary | ICD-10-CM | POA: Diagnosis not present

## 2017-06-28 ENCOUNTER — Ambulatory Visit: Payer: Self-pay | Admitting: Gastroenterology

## 2017-07-04 DIAGNOSIS — R1907 Generalized intra-abdominal and pelvic swelling, mass and lump: Secondary | ICD-10-CM | POA: Diagnosis not present

## 2017-07-15 DIAGNOSIS — R0602 Shortness of breath: Secondary | ICD-10-CM | POA: Diagnosis not present

## 2017-07-15 DIAGNOSIS — M654 Radial styloid tenosynovitis [de Quervain]: Secondary | ICD-10-CM | POA: Diagnosis not present

## 2017-07-15 DIAGNOSIS — M539 Dorsopathy, unspecified: Secondary | ICD-10-CM | POA: Diagnosis not present

## 2017-07-15 DIAGNOSIS — R52 Pain, unspecified: Secondary | ICD-10-CM | POA: Diagnosis not present

## 2017-07-15 DIAGNOSIS — G4730 Sleep apnea, unspecified: Secondary | ICD-10-CM | POA: Diagnosis not present

## 2017-07-15 DIAGNOSIS — I10 Essential (primary) hypertension: Secondary | ICD-10-CM | POA: Diagnosis not present

## 2017-07-18 DIAGNOSIS — G4750 Parasomnia, unspecified: Secondary | ICD-10-CM | POA: Diagnosis not present

## 2017-07-18 DIAGNOSIS — G4710 Hypersomnia, unspecified: Secondary | ICD-10-CM | POA: Diagnosis not present

## 2017-07-18 DIAGNOSIS — G2581 Restless legs syndrome: Secondary | ICD-10-CM | POA: Diagnosis not present

## 2017-07-19 ENCOUNTER — Other Ambulatory Visit: Payer: Self-pay | Admitting: *Deleted

## 2017-07-19 MED ORDER — CYCLOBENZAPRINE HCL 10 MG PO TABS: ORAL_TABLET | ORAL | 0 refills | Status: DC

## 2017-07-29 ENCOUNTER — Encounter: Payer: Self-pay | Admitting: Family Medicine

## 2017-08-01 DIAGNOSIS — G4710 Hypersomnia, unspecified: Secondary | ICD-10-CM | POA: Diagnosis not present

## 2017-08-12 DIAGNOSIS — M545 Low back pain: Secondary | ICD-10-CM | POA: Diagnosis not present

## 2017-08-12 DIAGNOSIS — I10 Essential (primary) hypertension: Secondary | ICD-10-CM | POA: Diagnosis not present

## 2017-08-12 DIAGNOSIS — G4733 Obstructive sleep apnea (adult) (pediatric): Secondary | ICD-10-CM | POA: Diagnosis not present

## 2017-08-15 DIAGNOSIS — M654 Radial styloid tenosynovitis [de Quervain]: Secondary | ICD-10-CM | POA: Diagnosis not present

## 2017-08-15 DIAGNOSIS — G5601 Carpal tunnel syndrome, right upper limb: Secondary | ICD-10-CM | POA: Diagnosis not present

## 2017-08-25 DIAGNOSIS — G5601 Carpal tunnel syndrome, right upper limb: Secondary | ICD-10-CM | POA: Diagnosis not present

## 2017-08-25 DIAGNOSIS — I10 Essential (primary) hypertension: Secondary | ICD-10-CM | POA: Diagnosis not present

## 2017-08-25 DIAGNOSIS — M654 Radial styloid tenosynovitis [de Quervain]: Secondary | ICD-10-CM | POA: Diagnosis not present

## 2017-09-02 DIAGNOSIS — G5601 Carpal tunnel syndrome, right upper limb: Secondary | ICD-10-CM | POA: Diagnosis not present

## 2017-09-05 DIAGNOSIS — M47812 Spondylosis without myelopathy or radiculopathy, cervical region: Secondary | ICD-10-CM | POA: Diagnosis not present

## 2017-09-05 DIAGNOSIS — M47816 Spondylosis without myelopathy or radiculopathy, lumbar region: Secondary | ICD-10-CM | POA: Diagnosis not present

## 2017-09-05 DIAGNOSIS — M7918 Myalgia, other site: Secondary | ICD-10-CM | POA: Diagnosis not present

## 2017-09-05 DIAGNOSIS — Z7689 Persons encountering health services in other specified circumstances: Secondary | ICD-10-CM | POA: Diagnosis not present

## 2017-09-06 ENCOUNTER — Telehealth: Payer: Self-pay

## 2017-09-06 DIAGNOSIS — F329 Major depressive disorder, single episode, unspecified: Secondary | ICD-10-CM

## 2017-09-06 DIAGNOSIS — F32A Depression, unspecified: Secondary | ICD-10-CM

## 2017-09-06 MED ORDER — ESCITALOPRAM OXALATE 20 MG PO TABS: 20.0000 mg | ORAL_TABLET | Freq: Every day | ORAL | 2 refills | Status: DC

## 2017-09-06 NOTE — Telephone Encounter (Signed)
Received rx request via fax for lexapro. Medication refilled per protocol.

## 2017-10-12 ENCOUNTER — Telehealth: Payer: Self-pay

## 2017-10-12 DIAGNOSIS — B001 Herpesviral vesicular dermatitis: Secondary | ICD-10-CM

## 2017-10-12 MED ORDER — VALACYCLOVIR HCL 500 MG PO TABS: 500.0000 mg | ORAL_TABLET | Freq: Two times a day (BID) | ORAL | 5 refills | Status: DC

## 2017-10-12 NOTE — Telephone Encounter (Signed)
Received refill request for valtrex via fax. Refilled per protocol.

## 2018-01-21 ENCOUNTER — Other Ambulatory Visit: Payer: Self-pay | Admitting: Family Medicine

## 2018-01-21 DIAGNOSIS — F329 Major depressive disorder, single episode, unspecified: Secondary | ICD-10-CM

## 2018-01-21 DIAGNOSIS — F32A Depression, unspecified: Secondary | ICD-10-CM

## 2018-01-24 ENCOUNTER — Other Ambulatory Visit: Payer: Self-pay | Admitting: *Deleted

## 2018-01-24 ENCOUNTER — Telehealth: Payer: Self-pay

## 2018-01-24 DIAGNOSIS — F329 Major depressive disorder, single episode, unspecified: Secondary | ICD-10-CM

## 2018-01-24 DIAGNOSIS — F32A Depression, unspecified: Secondary | ICD-10-CM

## 2018-01-24 NOTE — Telephone Encounter (Signed)
Ok to administer all 3 ---- as long as pt is not on medicare shingrix is fine

## 2018-01-24 NOTE — Telephone Encounter (Signed)
Patient called in to schedule appointment for her Tdap,Flu and Shingles immunizations. Returned call left message on her answering machine. Advised patient to check with her insurance company regarding her Shingles vaccination and whether it will be covered if given in this office. Need order to administer from provider.

## 2018-01-26 ENCOUNTER — Ambulatory Visit (INDEPENDENT_AMBULATORY_CARE_PROVIDER_SITE_OTHER): Payer: 59 | Admitting: Family Medicine

## 2018-01-26 DIAGNOSIS — Z23 Encounter for immunization: Secondary | ICD-10-CM

## 2018-01-26 DIAGNOSIS — Z0000 Encounter for general adult medical examination without abnormal findings: Secondary | ICD-10-CM

## 2018-01-26 MED ORDER — TETANUS-DIPHTH-ACELL PERTUSSIS 5-2.5-18.5 LF-MCG/0.5 IM SUSP
0.5000 mL | Freq: Once | INTRAMUSCULAR | Status: AC
  Administered 2018-01-26: 0.5 mL via INTRAMUSCULAR

## 2018-01-26 NOTE — Progress Notes (Signed)
Pt. here for annual flu vaccine, as well as shingrix #1, and tdap. Pt. OK to receive all 3 vaccines per Dr. Etter Sjogren note on 01/24/18. Immunizations given, pt. tolerated well.

## 2018-01-31 NOTE — Telephone Encounter (Signed)
Called patient to schedule appointment for immunizations. Unable to leave message for return call message box full.

## 2018-02-01 ENCOUNTER — Telehealth: Payer: Self-pay

## 2018-02-01 NOTE — Telephone Encounter (Signed)
Copied from [place] [phone]. Topic: General - Other >> Jan 23, 2018  2:46 PM Burchel, Abbi R wrote: Pt requesting Tdap, Flu and Shingles vaccines.   [phone]

## 2018-02-06 ENCOUNTER — Encounter: Payer: Self-pay | Admitting: Family Medicine

## 2018-02-06 ENCOUNTER — Ambulatory Visit: Payer: 59 | Admitting: Family Medicine

## 2018-02-06 VITALS — BP 138/84 | HR 80 | Temp 98.1°F | Ht 64.0 in | Wt 200.0 lb

## 2018-02-06 DIAGNOSIS — R05 Cough: Secondary | ICD-10-CM

## 2018-02-06 DIAGNOSIS — R059 Cough, unspecified: Secondary | ICD-10-CM

## 2018-02-06 MED ORDER — AZITHROMYCIN 250 MG PO TABS: ORAL_TABLET | ORAL | 0 refills | Status: DC

## 2018-02-06 MED ORDER — BENZONATATE 100 MG PO CAPS: 100.0000 mg | ORAL_CAPSULE | Freq: Two times a day (BID) | ORAL | 0 refills | Status: DC | PRN

## 2018-02-06 NOTE — Assessment & Plan Note (Signed)
Likely viral in nature.  No signs of wheezing on exam. -Counseled on supportive care. -Tessalon Perles. -Provide azithromycin if no improvement. -Given indications to follow-up

## 2018-02-06 NOTE — Progress Notes (Signed)
Lacey Johns - 62 y.o. female MRN [id number]  Date of birth: 1955-05-26  SUBJECTIVE:  Including CC & ROS.  Chief Complaint  Patient presents with  . Cough    congestion     Lacey Johns is a 62 y.o. female that is presenting with cough and congestion.  She reports her symptoms been present for about 4 days.  She denies any fevers.  Has a history of asthmatic attacks that are related to viral illnesses.  She feels like her cough is intermittent and worse at night.  Denies any recent travel..    Review of Systems  Constitutional: Negative for fever.  HENT: Positive for congestion.   Respiratory: Positive for cough.   Cardiovascular: Negative for chest pain.  Gastrointestinal: Negative for abdominal pain.  Musculoskeletal: Negative for gait problem.  Skin: Negative for color change.    HISTORY: Past Medical, Surgical, Social, and Family History Reviewed & Updated per EMR.   Pertinent Historical Findings include:  Past Medical History:  Diagnosis Date  . Abnormal mammogram of right breast   . Abnormal Pap smear of cervix   . Ankle fracture, right   . Asthma   . Carpal tunnel syndrome, bilateral   . Diabetes mellitus without complication ([place])   . GERD (gastroesophageal reflux disease)   . Hypertension     Past Surgical History:  Procedure Laterality Date  . CERVICAL CONE BIOPSY    . NASAL SEPTUM SURGERY    . NECK SURGERY    . OVARIAN CYST REMOVAL    . TONSILLECTOMY    . WRIST SURGERY      No Known Allergies  Family History  Problem Relation Age of Onset  . Hypertension Mother   . Hypertension Father   . Hyperlipidemia Father   . Diabetes Father   . Breast cancer Maternal Aunt   . ALS Maternal Aunt   . Parkinson's disease Maternal Aunt      Social History   Socioeconomic History  . Marital status: Married    Spouse name: Not on file  . Number of children: Not on file  . Years of education: Not on file  . Highest education level: Not on file    Occupational History  . Not on file  Social Needs  . Financial resource strain: Not on file  . Food insecurity:    Worry: Not on file    Inability: Not on file  . Transportation needs:    Medical: Not on file    Non-medical: Not on file  Tobacco Use  . Smoking status: Never Smoker  . Smokeless tobacco: Never Used  Substance and Sexual Activity  . Alcohol use: No    Alcohol/week: 0.0 standard drinks  . Drug use: No  . Sexual activity: Yes    Partners: Male  Lifestyle  . Physical activity:    Days per week: Not on file    Minutes per session: Not on file  . Stress: Not on file  Relationships  . Social connections:    Talks on phone: Not on file    Gets together: Not on file    Attends religious service: Not on file    Active member of club or organization: Not on file    Attends meetings of clubs or organizations: Not on file    Relationship status: Not on file  . Intimate partner violence:    Fear of current or ex partner: Not on file    Emotionally abused: Not on file  Physically abused: Not on file    Forced sexual activity: Not on file  Other Topics Concern  . Not on file  Social History Narrative  . Not on file     PHYSICAL EXAM:  VS: BP 138/84 (BP Location: Right Arm, Patient Position: Sitting, Cuff Size: Large)   Pulse 80   Temp 98.1 F (36.7 C) (Oral)   Ht 5\' 4"  (1.626 m)   Wt 200 lb (90.7 kg)   SpO2 95%   BMI 34.33 kg/m  Physical Exam Gen: NAD, alert, cooperative with exam,  ENT: normal lips, normal nasal mucosa, tympanic membranes clear and intact bilaterally, normal oropharynx, no cervical lymphadenopathy Eye: normal EOM, normal conjunctiva and lids CV:  no edema, +2 pedal pulses, regular rate and rhythm, S1-S2   Resp: no accessory muscle use, non-labored, clear to auscultation bilaterally, no crackles or wheezes Skin: no rashes, no areas of induration  Neuro: normal tone, normal sensation to touch Psych:  normal insight, alert and  oriented MSK: Normal gait, normal strength        ASSESSMENT & PLAN:   Cough Likely viral in nature.  No signs of wheezing on exam. -Counseled on supportive care. -Tessalon Perles. -Provide azithromycin if no improvement. -Given indications to follow-up

## 2018-02-06 NOTE — Patient Instructions (Signed)
Nice to meet you  Please try honey, lozenges, vick's vapor rub and humdifier for cough  Please try things such as zyrtec-D or allegra-D which is an antihistamine and decongestant.  Please try afrin which will help with nasal congestion but use for only three days.  Please follow up if your symptoms do not improve after 10 days

## 2018-03-06 ENCOUNTER — Encounter: Payer: Self-pay | Admitting: Family Medicine

## 2018-03-06 ENCOUNTER — Ambulatory Visit: Payer: 59 | Admitting: Family Medicine

## 2018-03-06 ENCOUNTER — Ambulatory Visit: Payer: Self-pay | Admitting: *Deleted

## 2018-03-06 VITALS — BP 120/80 | HR 77 | Temp 98.0°F | Resp 16 | Ht 64.0 in | Wt 201.4 lb

## 2018-03-06 DIAGNOSIS — J40 Bronchitis, not specified as acute or chronic: Secondary | ICD-10-CM | POA: Diagnosis not present

## 2018-03-06 MED ORDER — CEFUROXIME AXETIL 500 MG PO TABS
500.0000 mg | ORAL_TABLET | Freq: Two times a day (BID) | ORAL | 0 refills | Status: DC
Start: 2018-03-06 — End: 2018-05-08

## 2018-03-06 MED ORDER — PREDNISONE 10 MG PO TABS: ORAL_TABLET | ORAL | 0 refills | Status: DC

## 2018-03-06 MED ORDER — BENZONATATE 100 MG PO CAPS: 200.0000 mg | ORAL_CAPSULE | Freq: Three times a day (TID) | ORAL | 0 refills | Status: DC | PRN

## 2018-03-06 NOTE — Patient Instructions (Signed)

## 2018-03-06 NOTE — Progress Notes (Signed)
Patient ID: Lacey Johns, female    DOB: December 13, 1955  Age: 63 y.o. MRN: [id number]    Subjective:  Subjective  HPI Lacey Johns presents for cough wheezing x several weeks.  Pt was seen in another office previously and given tessalon and z pack --- she is no better .  No fever.  She thinks she is getting worse  Review of Systems  Constitutional: Negative for appetite change, diaphoresis, fatigue and unexpected weight change.  Eyes: Negative for pain, redness and visual disturbance.  Respiratory: Positive for cough and wheezing. Negative for chest tightness and shortness of breath.   Cardiovascular: Negative for chest pain, palpitations and leg swelling.  Endocrine: Negative for cold intolerance, heat intolerance, polydipsia, polyphagia and polyuria.  Genitourinary: Negative for difficulty urinating, dysuria and frequency.  Neurological: Negative for dizziness, light-headedness, numbness and headaches.    History Past Medical History:  Diagnosis Date  . Abnormal mammogram of right breast   . Abnormal Pap smear of cervix   . Ankle fracture, right   . Asthma   . Carpal tunnel syndrome, bilateral   . Diabetes mellitus without complication ([place])   . GERD (gastroesophageal reflux disease)   . Hypertension     She has a past surgical history that includes Tonsillectomy; Ovarian cyst removal; Cervical cone biopsy; Wrist surgery; Nasal septum surgery; and Neck surgery.   Her family history includes ALS in her maternal aunt; Breast cancer in her maternal aunt; Diabetes in her father; Hyperlipidemia in her father; Hypertension in her father and mother; Parkinson's disease in her maternal aunt.She reports that she has never smoked. She has never used smokeless tobacco. She reports that she does not drink alcohol or use drugs.  Current Outpatient Medications on File Prior to Visit  Medication Sig Dispense Refill  . tiZANidine (ZANAFLEX) 4 MG tablet Take 1 tablet by mouth every 8 (eight)  hours as needed.    Marland Kitchen albuterol (PROVENTIL HFA;VENTOLIN HFA) 108 (90 BASE) MCG/ACT inhaler Inhale 2 puffs into the lungs every 6 (six) hours as needed for wheezing or shortness of breath. 1 Inhaler 3  . benzonatate (TESSALON) 100 MG capsule Take 1 capsule (100 mg total) by mouth 2 (two) times daily as needed for cough. 25 capsule 0  . Coenzyme Q10 (COQ-10 PO) Take by mouth.    . cyclobenzaprine (FLEXERIL) 10 MG tablet Take 1/2 to 1 tablet twice a day for muscle spasms 30 tablet 0  . escitalopram (LEXAPRO) 20 MG tablet TAKE ONE (1) TABLET BY MOUTH EVERY DAY 90 tablet 1  . fluticasone (FLONASE) 50 MCG/ACT nasal spray Place 2 sprays into both nostrils daily. 16 g 1  . fluticasone (FLOVENT HFA) 110 MCG/ACT inhaler Inhale 2 puffs into the lungs 2 (two) times daily. 1 Inhaler 2  . HYDROcodone-acetaminophen (NORCO) 5-325 MG tablet Take 1-2 tablets by mouth every 6 (six) hours as needed for severe pain. 10 tablet 0  . levocetirizine (XYZAL) 5 MG tablet Take 1 tablet (5 mg total) by mouth every evening. 30 tablet 0  . linaclotide (LINZESS) 290 MCG CAPS capsule Take 1 capsule (290 mcg total) by mouth daily before breakfast. 30 capsule 2  . lisinopril-hydrochlorothiazide (PRINZIDE,ZESTORETIC) 10-12.5 MG tablet Take 1 tablet by mouth daily. 90 tablet 3  . NADH-ASCORBIC ACID-SOD BICARB PO Take by mouth.    . valACYclovir (VALTREX) 500 MG tablet Take 1 tablet (500 mg total) by mouth 2 (two) times daily. 20 tablet 5   No current facility-administered medications on file prior to visit.  Objective:  Objective  Physical Exam Vitals signs and nursing note reviewed.  Constitutional:      Appearance: She is well-developed.  HENT:     Head: Normocephalic and atraumatic.  Eyes:     Conjunctiva/sclera: Conjunctivae normal.  Neck:     Musculoskeletal: Normal range of motion and neck supple.     Thyroid: No thyromegaly.     Vascular: No carotid bruit or JVD.  Cardiovascular:     Rate and Rhythm: Normal  rate and regular rhythm.     Heart sounds: Normal heart sounds. No murmur.  Pulmonary:     Effort: Pulmonary effort is normal. No respiratory distress.     Breath sounds: Wheezing present. No rales.     Comments: Dec breath sounds relieved with neb ---albuterol  Chest:     Chest wall: No tenderness.  Neurological:     Mental Status: She is alert and oriented to person, place, and time.    BP 120/80 (BP Location: Left Arm, Cuff Size: Normal)   Pulse 77   Temp 98 F (36.7 C) (Oral)   Resp 16   Ht 5\' 4"  (1.626 m)   Wt 201 lb 6.4 oz (91.4 kg)   SpO2 97%   BMI 34.57 kg/m  Wt Readings from Last 3 Encounters:  03/06/18 201 lb 6.4 oz (91.4 kg)  02/06/18 200 lb (90.7 kg)  06/20/17 188 lb (85.3 kg)     Lab Results  Component Value Date   WBC 9.0 06/06/2017   HGB 14.6 06/06/2017   HCT 41.2 06/06/2017   PLT 306 06/06/2017   GLUCOSE 111 (H) 06/06/2017   CHOL 206 (H) 05/06/2017   TRIG 128 05/06/2017   HDL 58 05/06/2017   LDLCALC 124 (H) 05/06/2017   ALT 26 05/06/2017   AST 24 05/06/2017   NA 136 06/06/2017   K 3.9 06/06/2017   CL 106 06/06/2017   CREATININE 0.77 06/06/2017   BUN 12 06/06/2017   CO2 22 06/06/2017   TSH 2.45 05/06/2017    Ct Renal Stone Study  Result Date: 06/06/2017 CLINICAL DATA:  Acute onset of lower back pain radiating to both lower extremities. Headache. EXAM: CT ABDOMEN AND PELVIS WITHOUT CONTRAST TECHNIQUE: Multidetector CT imaging of the abdomen and pelvis was performed following the standard protocol without IV contrast. COMPARISON:  None. FINDINGS: Lower chest: The visualized lung bases are grossly clear. The visualized portions of the mediastinum are unremarkable. Hepatobiliary: Large hepatic cysts measure up to 12.2 cm in size. The gallbladder is unremarkable. The common bile duct is normal in caliber. Pancreas: The pancreas is within normal limits. Spleen: The spleen is unremarkable in appearance. Adrenals/Urinary Tract: The adrenal glands are  unremarkable in appearance. The kidneys are within normal limits. There is no evidence of hydronephrosis. No renal or ureteral stones are identified. No perinephric stranding is seen. Stomach/Bowel: The stomach is unremarkable in appearance. The small bowel is within normal limits. The appendix is normal in caliber, without evidence of appendicitis. The colon is unremarkable in appearance. Vascular/Lymphatic: The abdominal aorta is unremarkable in appearance. The inferior vena cava is grossly unremarkable. No retroperitoneal lymphadenopathy is seen. No pelvic sidewall lymphadenopathy is identified. Reproductive: The bladder is mildly distended and grossly unremarkable. The uterus is unremarkable in appearance. The ovaries are relatively symmetric. No suspicious adnexal masses are seen. Other: No additional soft tissue abnormalities are seen. Musculoskeletal: No acute osseous abnormalities are identified. Facet disease is noted at the lower lumbar spine. The visualized musculature is unremarkable  in appearance. IMPRESSION: 1. No acute abnormality seen within the abdomen or pelvis. 2. Large hepatic cysts measure up to 12.2 cm in size. Electronically Signed   By: Garald Balding M.D.   On: 06/06/2017 21:39     Assessment & Plan:  Plan  I have discontinued Belenda Cruise Soto's Calcium Citrate-Vitamin D, Glucosamine, Ascorbic Acid (VITAMIN C PO), (VITAMIN D, ERGOCALCIFEROL, PO), buPROPion, oxyCODONE-acetaminophen, dexlansoprazole, predniSONE, and azithromycin. I am also having her start on cefUROXime and predniSONE. Additionally, I am having her maintain her albuterol, NADH-ASCORBIC ACID-SOD BICARB PO, Coenzyme Q10 (COQ-10 PO), lisinopril-hydrochlorothiazide, linaclotide, fluticasone, levocetirizine, fluticasone, HYDROcodone-acetaminophen, cyclobenzaprine, valACYclovir, escitalopram, benzonatate, and tiZANidine.  Meds ordered this encounter  Medications  . cefUROXime (CEFTIN) 500 MG tablet    Sig: Take 1 tablet (500  mg total) by mouth 2 (two) times daily with a meal.    Dispense:  20 tablet    Refill:  0  . predniSONE (DELTASONE) 10 MG tablet    Sig: TAKE 3 TABLETS PO QD FOR 3 DAYS THEN TAKE 2 TABLETS PO QD FOR 3 DAYS THEN TAKE 1 TABLET PO QD FOR 3 DAYS THEN TAKE 1/2 TAB PO QD FOR 3 DAYS    Dispense:  20 tablet    Refill:  0    Problem List Items Addressed This Visit    None    Visit Diagnoses    Bronchitis    -  Primary   Relevant Medications   cefUROXime (CEFTIN) 500 MG tablet   predniSONE (DELTASONE) 10 MG tablet    depo medrol 80 given Start pred tomorrow Pt has neb at home  Tessalon perles  Follow-up: Return if symptoms worsen or fail to improve.  Ann Held, DO

## 2018-03-06 NOTE — Telephone Encounter (Signed)
contacted pt regarding her symptoms; she complains of a  Cough that keeps her awake at night, wheezing; pt seen in office 02/06/2018; recommendations made per nurse triage protocol; pt offered and accepted appointment with Dr Etter Sjogren. [facility], 03/06/2018 at 1515; she verbalized understanding; will route to office for notification.   Reason for Disposition . [1] Continuous (nonstop) coughing interferes with work or school AND [2] no improvement using cough treatment per Care Advice  Answer Assessment - Initial Assessment Questions 1. ONSET: "When did the cough begin?"      Seen in office 2. SEVERITY: "How bad is the cough today?"      severe 3. RESPIRATORY DISTRESS: "Describe your breathing."      Shortness of breath with exertion 4. FEVER: "Do you have a fever?" If so, ask: "What is your temperature, how was it measured, and when did it start?"     no 5. SPUTUM: "Describe the color of your sputum" (clear, white, yellow, green)     clear 6. HEMOPTYSIS: "Are you coughing up any blood?" If so ask: "How much?" (flecks, streaks, tablespoons, etc.)     no 7. CARDIAC HISTORY: "Do you have any history of heart disease?" (e.g., heart attack, congestive heart failure)      hyertension 8. LUNG HISTORY: "Do you have any history of lung disease?"  (e.g., pulmonary embolus, asthma, emphysema)     asthma 9. PE RISK FACTORS: "Do you have a history of blood clots?" (or: recent major surgery, recent prolonged travel, bedridden)     no 10. OTHER SYMPTOMS: "Do you have any other symptoms?" (e.g., runny nose, wheezing, chest pain)       Wheezing; hard to take a deep breath in 11. PREGNANCY: "Is there any chance you are pregnant?" "When was your last menstrual period?"     no   12. TRAVEL: "Have you traveled out of the country in the last month?" (e.g., travel history, exposures)       no  Protocols used: [place]

## 2018-03-07 DIAGNOSIS — R21 Rash and other nonspecific skin eruption: Secondary | ICD-10-CM | POA: Diagnosis not present

## 2018-03-07 DIAGNOSIS — L728 Other follicular cysts of the skin and subcutaneous tissue: Secondary | ICD-10-CM | POA: Diagnosis not present

## 2018-03-07 DIAGNOSIS — B078 Other viral warts: Secondary | ICD-10-CM | POA: Diagnosis not present

## 2018-03-14 ENCOUNTER — Encounter: Payer: Self-pay | Admitting: Family

## 2018-03-14 ENCOUNTER — Ambulatory Visit (HOSPITAL_BASED_OUTPATIENT_CLINIC_OR_DEPARTMENT_OTHER)
Admission: RE | Admit: 2018-03-14 | Discharge: 2018-03-14 | Disposition: A | Discharge status: Home / Self Care | Payer: 59 | Source: Ambulatory Visit | Attending: Family | Admitting: Family

## 2018-03-14 ENCOUNTER — Ambulatory Visit: Payer: 59 | Admitting: Family

## 2018-03-14 VITALS — BP 140/72 | HR 67 | Temp 98.4°F | Resp 16 | Ht 64.0 in | Wt 200.0 lb

## 2018-03-14 DIAGNOSIS — J411 Mucopurulent chronic bronchitis: Secondary | ICD-10-CM

## 2018-03-14 DIAGNOSIS — J40 Bronchitis, not specified as acute or chronic: Secondary | ICD-10-CM | POA: Diagnosis not present

## 2018-03-14 DIAGNOSIS — R05 Cough: Secondary | ICD-10-CM | POA: Diagnosis not present

## 2018-03-14 MED ORDER — BUDESONIDE-FORMOTEROL FUMARATE 80-4.5 MCG/ACT IN AERO: 2.0000 | INHALATION_SPRAY | Freq: Two times a day (BID) | RESPIRATORY_TRACT | 3 refills | Status: DC

## 2018-03-14 MED ORDER — BENZONATATE 100 MG PO CAPS: 200.0000 mg | ORAL_CAPSULE | Freq: Three times a day (TID) | ORAL | 0 refills | Status: DC | PRN

## 2018-03-14 MED ORDER — MONTELUKAST SODIUM 10 MG PO TABS: 10.0000 mg | ORAL_TABLET | Freq: Every day | ORAL | 3 refills | Status: DC

## 2018-03-14 NOTE — Patient Instructions (Signed)
Please add singulair once daily. Add symbicort twice daily Continue albuterol every 4-6 hours as needed. Continue tessalon as needed. Complete antibiotics/prednisone. Complete chest x-ray on the first floor. Call if fever, shortness of breath, or if not improved in 1-2 weeks.

## 2018-03-14 NOTE — Progress Notes (Signed)
Subjective:    Patient ID: Lacey Johns, female    DOB: [birth year], 63 y.o.   MRN: [id number]  HPI  Patient is a 63 yr old female who presents today for follow up of her bronchitis. She was seen on 03/06/17 by Dr. Etter Sjogren. Prior to that she was seen at another office on 02/06/18 and was given tessalon and zpak. Felt that she was getting worse when she saw Dr. Etter Sjogren. That day she was treated with cefuroxime and a prednisone taper, albuterol nebs.   Reports that the nebulizer helps but it wears off in a few hours.  Cough is "maybe a little better" mild post nasal drip but no real sinus pain/pressure.  Cough is waking her up at night.  Frustrated by ongoing symptoms.    Review of Systems See HPI  Past Medical History:  Diagnosis Date  . Abnormal mammogram of right breast   . Abnormal Pap smear of cervix   . Ankle fracture, right   . Asthma   . Carpal tunnel syndrome, bilateral   . Diabetes mellitus without complication ([place])   . GERD (gastroesophageal reflux disease)   . Hypertension      Social History   Socioeconomic History  . Marital status: Married    Spouse name: Not on file  . Number of children: Not on file  . Years of education: Not on file  . Highest education level: Not on file  Occupational History  . Not on file  Social Needs  . Financial resource strain: Not on file  . Food insecurity:    Worry: Not on file    Inability: Not on file  . Transportation needs:    Medical: Not on file    Non-medical: Not on file  Tobacco Use  . Smoking status: Never Smoker  . Smokeless tobacco: Never Used  Substance and Sexual Activity  . Alcohol use: No    Alcohol/week: 0.0 standard drinks  . Drug use: No  . Sexual activity: Yes    Partners: Male  Lifestyle  . Physical activity:    Days per week: Not on file    Minutes per session: Not on file  . Stress: Not on file  Relationships  . Social connections:    Talks on phone: Not on file    Gets together: Not on file      Attends religious service: Not on file    Active member of club or organization: Not on file    Attends meetings of clubs or organizations: Not on file    Relationship status: Not on file  . Intimate partner violence:    Fear of current or ex partner: Not on file    Emotionally abused: Not on file    Physically abused: Not on file    Forced sexual activity: Not on file  Other Topics Concern  . Not on file  Social History Narrative  . Not on file    Past Surgical History:  Procedure Laterality Date  . CERVICAL CONE BIOPSY    . NASAL SEPTUM SURGERY    . NECK SURGERY    . OVARIAN CYST REMOVAL    . TONSILLECTOMY    . WRIST SURGERY      Family History  Problem Relation Age of Onset  . Hypertension Mother   . Hypertension Father   . Hyperlipidemia Father   . Diabetes Father   . Breast cancer Maternal Aunt   . ALS Maternal Aunt   . Parkinson's  disease Maternal Aunt     No Known Allergies  Current Outpatient Medications on File Prior to Visit  Medication Sig Dispense Refill  . albuterol (PROVENTIL HFA;VENTOLIN HFA) 108 (90 BASE) MCG/ACT inhaler Inhale 2 puffs into the lungs every 6 (six) hours as needed for wheezing or shortness of breath. 1 Inhaler 3  . benzonatate (TESSALON) 100 MG capsule Take 1 capsule (100 mg total) by mouth 2 (two) times daily as needed for cough. 25 capsule 0  . benzonatate (TESSALON) 100 MG capsule Take 2 capsules (200 mg total) by mouth 3 (three) times daily as needed for cough. 20 capsule 0  . cefUROXime (CEFTIN) 500 MG tablet Take 1 tablet (500 mg total) by mouth 2 (two) times daily with a meal. 20 tablet 0  . Coenzyme Q10 (COQ-10 PO) Take by mouth.    . cyclobenzaprine (FLEXERIL) 10 MG tablet Take 1/2 to 1 tablet twice a day for muscle spasms 30 tablet 0  . escitalopram (LEXAPRO) 20 MG tablet TAKE ONE (1) TABLET BY MOUTH EVERY DAY 90 tablet 1  . fluticasone (FLONASE) 50 MCG/ACT nasal spray Place 2 sprays into both nostrils daily. 16 g 1  .  fluticasone (FLOVENT HFA) 110 MCG/ACT inhaler Inhale 2 puffs into the lungs 2 (two) times daily. 1 Inhaler 2  . HYDROcodone-acetaminophen (NORCO) 5-325 MG tablet Take 1-2 tablets by mouth every 6 (six) hours as needed for severe pain. 10 tablet 0  . levocetirizine (XYZAL) 5 MG tablet Take 1 tablet (5 mg total) by mouth every evening. 30 tablet 0  . linaclotide (LINZESS) 290 MCG CAPS capsule Take 1 capsule (290 mcg total) by mouth daily before breakfast. 30 capsule 2  . lisinopril-hydrochlorothiazide (PRINZIDE,ZESTORETIC) 10-12.5 MG tablet Take 1 tablet by mouth daily. 90 tablet 3  . NADH-ASCORBIC ACID-SOD BICARB PO Take by mouth.    Marland Kitchen tiZANidine (ZANAFLEX) 4 MG tablet Take 1 tablet by mouth every 8 (eight) hours as needed.    . valACYclovir (VALTREX) 500 MG tablet Take 1 tablet (500 mg total) by mouth 2 (two) times daily. 20 tablet 5  . predniSONE (DELTASONE) 10 MG tablet TAKE 3 TABLETS PO QD FOR 3 DAYS THEN TAKE 2 TABLETS PO QD FOR 3 DAYS THEN TAKE 1 TABLET PO QD FOR 3 DAYS THEN TAKE 1/2 TAB PO QD FOR 3 DAYS (Patient not taking: Reported on 03/14/2018) 20 tablet 0   No current facility-administered medications on file prior to visit.     BP 140/72 (BP Location: Right Arm, Patient Position: Sitting, Cuff Size: Small)   Pulse 67   Temp 98.4 F (36.9 C) (Oral)   Resp 16   Ht 5\' 4"  (1.626 m)   Wt 200 lb (90.7 kg)   SpO2 99%   BMI 34.33 kg/m       Objective:   Physical Exam Constitutional:      Appearance: She is well-developed.  HENT:     Left Ear: Tympanic membrane and ear canal normal.     Ears:     Comments: R TM occluded by cerumen    Nose: Rhinorrhea present.     Mouth/Throat:     Mouth: Mucous membranes are moist.     Pharynx: Oropharynx is clear. No oropharyngeal exudate or posterior oropharyngeal erythema.  Neck:     Musculoskeletal: Neck supple.     Thyroid: No thyromegaly.  Cardiovascular:     Rate and Rhythm: Normal rate and regular rhythm.     Heart sounds: Normal  heart sounds.  No murmur.  Pulmonary:     Effort: Pulmonary effort is normal. No respiratory distress.     Breath sounds: Normal breath sounds. No wheezing.  Skin:    General: Skin is warm and dry.  Neurological:     Mental Status: She is alert and oriented to person, place, and time.  Psychiatric:        Behavior: Behavior normal.        Thought Content: Thought content normal.        Judgment: Judgment normal.           Assessment & Plan:  Bronchitis- will obtain CXR to rule out pneumonia.  I suspect asthma component but if symptoms do not improve will need to consider d/c ace inhibitor and possible pulmonary consult.   Pt is advised on plan as follows:  Please add singulair once daily. Add symbicort twice daily Continue albuterol every 4-6 hours as needed. Continue tessalon as needed. Complete antibiotics/prednisone. Complete chest x-ray on the first floor. Call if fever, shortness of breath, or if not improved in 1-2 weeks.

## 2018-04-03 DIAGNOSIS — I10 Essential (primary) hypertension: Secondary | ICD-10-CM | POA: Diagnosis not present

## 2018-04-03 DIAGNOSIS — H6121 Impacted cerumen, right ear: Secondary | ICD-10-CM | POA: Diagnosis not present

## 2018-04-18 DIAGNOSIS — S93602A Unspecified sprain of left foot, initial encounter: Secondary | ICD-10-CM | POA: Diagnosis not present

## 2018-04-18 DIAGNOSIS — R52 Pain, unspecified: Secondary | ICD-10-CM | POA: Diagnosis not present

## 2018-04-18 DIAGNOSIS — S93401A Sprain of unspecified ligament of right ankle, initial encounter: Secondary | ICD-10-CM | POA: Diagnosis not present

## 2018-05-08 ENCOUNTER — Other Ambulatory Visit: Payer: Self-pay

## 2018-05-08 ENCOUNTER — Encounter: Payer: Self-pay | Admitting: Family Medicine

## 2018-05-08 ENCOUNTER — Ambulatory Visit: Payer: 59 | Admitting: Family Medicine

## 2018-05-08 VITALS — BP 116/70 | HR 62 | Temp 98.5°F | Resp 12 | Ht 64.0 in | Wt 202.8 lb

## 2018-05-08 DIAGNOSIS — F32A Depression, unspecified: Secondary | ICD-10-CM

## 2018-05-08 DIAGNOSIS — I10 Essential (primary) hypertension: Secondary | ICD-10-CM | POA: Diagnosis not present

## 2018-05-08 DIAGNOSIS — M545 Low back pain, unspecified: Secondary | ICD-10-CM

## 2018-05-08 DIAGNOSIS — M62838 Other muscle spasm: Secondary | ICD-10-CM

## 2018-05-08 DIAGNOSIS — K219 Gastro-esophageal reflux disease without esophagitis: Secondary | ICD-10-CM

## 2018-05-08 DIAGNOSIS — Z23 Encounter for immunization: Secondary | ICD-10-CM

## 2018-05-08 DIAGNOSIS — Z0000 Encounter for general adult medical examination without abnormal findings: Secondary | ICD-10-CM

## 2018-05-08 DIAGNOSIS — J40 Bronchitis, not specified as acute or chronic: Secondary | ICD-10-CM

## 2018-05-08 DIAGNOSIS — B001 Herpesviral vesicular dermatitis: Secondary | ICD-10-CM | POA: Diagnosis not present

## 2018-05-08 DIAGNOSIS — J45990 Exercise induced bronchospasm: Secondary | ICD-10-CM

## 2018-05-08 DIAGNOSIS — F329 Major depressive disorder, single episode, unspecified: Secondary | ICD-10-CM

## 2018-05-08 DIAGNOSIS — J4520 Mild intermittent asthma, uncomplicated: Secondary | ICD-10-CM

## 2018-05-08 LAB — CBC WITH DIFFERENTIAL/PLATELET
Basophils Absolute: 0.1 10*3/uL (ref 0.0–0.1)
Basophils Relative: 0.8 % (ref 0.0–3.0)
EOS PCT: 2.7 % (ref 0.0–5.0)
Eosinophils Absolute: 0.2 10*3/uL (ref 0.0–0.7)
HCT: 41.5 % (ref 36.0–46.0)
Hemoglobin: 14.1 g/dL (ref 12.0–15.0)
Lymphocytes Relative: 34.5 % (ref 12.0–46.0)
Lymphs Abs: 2.3 10*3/uL (ref 0.7–4.0)
MCHC: 34 g/dL (ref 30.0–36.0)
MCV: 89.8 fl (ref 78.0–100.0)
Monocytes Absolute: 0.5 10*3/uL (ref 0.1–1.0)
Monocytes Relative: 6.7 % (ref 3.0–12.0)
Neutro Abs: 3.8 10*3/uL (ref 1.4–7.7)
Neutrophils Relative %: 55.3 % (ref 43.0–77.0)
PLATELETS: 289 10*3/uL (ref 150.0–400.0)
RBC: 4.63 Mil/uL (ref 3.87–5.11)
RDW: 14.8 % (ref 11.5–15.5)
WBC: 6.8 10*3/uL (ref 4.0–10.5)

## 2018-05-08 LAB — COMPREHENSIVE METABOLIC PANEL
ALT: 16 U/L (ref 0–35)
AST: 15 U/L (ref 0–37)
Albumin: 4.2 g/dL (ref 3.5–5.2)
Alkaline Phosphatase: 109 U/L (ref 39–117)
BUN: 14 mg/dL (ref 6–23)
CHLORIDE: 102 meq/L (ref 96–112)
CO2: 30 mEq/L (ref 19–32)
Calcium: 9.8 mg/dL (ref 8.4–10.5)
Creatinine, Ser: 0.76 mg/dL (ref 0.40–1.20)
GFR: 77.31 mL/min (ref >60.00)
Glucose, Bld: 95 mg/dL (ref 70–99)
Potassium: 4.2 mEq/L (ref 3.5–5.1)
Sodium: 139 mEq/L (ref 135–145)
Total Bilirubin: 0.6 mg/dL (ref 0.2–1.2)
Total Protein: 6.5 g/dL (ref 6.0–8.3)

## 2018-05-08 LAB — LIPID PANEL
Cholesterol: 227 mg/dL — ABNORMAL HIGH (ref 0–200)
HDL: 53.1 mg/dL (ref >39.00)
LDL Cholesterol: 148 mg/dL — ABNORMAL HIGH (ref 0–99)
NonHDL: 173.47
TRIGLYCERIDES: 127 mg/dL (ref 0.0–149.0)
Total CHOL/HDL Ratio: 4
VLDL: 25.4 mg/dL (ref 0.0–40.0)

## 2018-05-08 LAB — POC URINALSYSI DIPSTICK (AUTOMATED)
BILIRUBIN UA: NEGATIVE
GLUCOSE UA: NEGATIVE
Ketones, UA: NEGATIVE
Leukocytes, UA: NEGATIVE
Nitrite, UA: NEGATIVE
PH UA: 6 (ref 5.0–8.0)
Protein, UA: POSITIVE — AB
Spec Grav, UA: 1.025 (ref 1.010–1.025)
Urobilinogen, UA: 0.2 E.U./dL

## 2018-05-08 LAB — TSH: TSH: 2.13 u[IU]/mL (ref 0.35–4.50)

## 2018-05-08 MED ORDER — VALACYCLOVIR HCL 500 MG PO TABS: 500.0000 mg | ORAL_TABLET | Freq: Two times a day (BID) | ORAL | 5 refills | Status: DC

## 2018-05-08 MED ORDER — BENZONATATE 100 MG PO CAPS: 200.0000 mg | ORAL_CAPSULE | Freq: Three times a day (TID) | ORAL | 0 refills | Status: AC | PRN

## 2018-05-08 MED ORDER — PANTOPRAZOLE SODIUM 40 MG PO TBEC: 40.0000 mg | DELAYED_RELEASE_TABLET | Freq: Every day | ORAL | 3 refills | Status: DC

## 2018-05-08 MED ORDER — MONTELUKAST SODIUM 10 MG PO TABS: 10.0000 mg | ORAL_TABLET | Freq: Every day | ORAL | 3 refills | Status: AC

## 2018-05-08 MED ORDER — ESCITALOPRAM OXALATE 20 MG PO TABS: ORAL_TABLET | ORAL | 1 refills | Status: AC

## 2018-05-08 MED ORDER — BUDESONIDE-FORMOTEROL FUMARATE 80-4.5 MCG/ACT IN AERO: 2.0000 | INHALATION_SPRAY | Freq: Two times a day (BID) | RESPIRATORY_TRACT | 3 refills | Status: AC

## 2018-05-08 MED ORDER — TIZANIDINE HCL 4 MG PO TABS: 4.0000 mg | ORAL_TABLET | Freq: Three times a day (TID) | ORAL | 1 refills | Status: AC | PRN

## 2018-05-08 NOTE — Progress Notes (Signed)
Subjective:     Lacey Johns is a 63 y.o. female and is here for a comprehensive physical exam. The patient reports problems - back pain -- wants her urine checked .  Social History   Socioeconomic History  . Marital status: Married    Spouse name: Not on file  . Number of children: Not on file  . Years of education: Not on file  . Highest education level: Not on file  Occupational History  . Not on file  Social Needs  . Financial resource strain: Not on file  . Food insecurity:    Worry: Not on file    Inability: Not on file  . Transportation needs:    Medical: Not on file    Non-medical: Not on file  Tobacco Use  . Smoking status: Never Smoker  . Smokeless tobacco: Never Used  Substance and Sexual Activity  . Alcohol use: No    Alcohol/week: 0.0 standard drinks  . Drug use: No  . Sexual activity: Yes    Partners: Male  Lifestyle  . Physical activity:    Days per week: Not on file    Minutes per session: Not on file  . Stress: Not on file  Relationships  . Social connections:    Talks on phone: Not on file    Gets together: Not on file    Attends religious service: Not on file    Active member of club or organization: Not on file    Attends meetings of clubs or organizations: Not on file    Relationship status: Not on file  . Intimate partner violence:    Fear of current or ex partner: Not on file    Emotionally abused: Not on file    Physically abused: Not on file    Forced sexual activity: Not on file  Other Topics Concern  . Not on file  Social History Narrative  . Not on file   Health Maintenance  Topic Date Due  . HIV Screening  02/16/1972  . COLONOSCOPY  02/16/2007  . MAMMOGRAM  09/08/2018  . PAP SMEAR-Modifier  05/06/2020  . TETANUS/TDAP  01/27/2028  . INFLUENZA VACCINE  Completed  . Hepatitis C Screening  Completed    The following portions of the patient's history were reviewed and updated as appropriate:  She  has a past medical history  of Abnormal mammogram of right breast, Abnormal Pap smear of cervix, Ankle fracture, right, Asthma, Carpal tunnel syndrome, bilateral, Diabetes mellitus without complication ([place]), GERD (gastroesophageal reflux disease), and Hypertension. She does not have any pertinent problems on file. She  has a past surgical history that includes Tonsillectomy; Ovarian cyst removal; Cervical cone biopsy; Wrist surgery; Nasal septum surgery; and Neck surgery. Her family history includes ALS in her maternal aunt; Breast cancer in her maternal aunt; Diabetes in her father; Hyperlipidemia in her father; Hypertension in her father and mother; Parkinson's disease in her maternal aunt. She  reports that she has never smoked. She has never used smokeless tobacco. She reports that she does not drink alcohol or use drugs. She has a current medication list which includes the following prescription(s): albuterol, benzonatate, budesonide-formoterol, cannabidiol, coenzyme q10, escitalopram, linaclotide, lisinopril-hydrochlorothiazide, montelukast, nadh-ascorbic acid-sod bicarb, NONFORMULARY OR COMPOUNDED ITEM, OVER THE COUNTER MEDICATION, tizanidine, valacyclovir, and pantoprazole. Current Outpatient Medications on File Prior to Visit  Medication Sig Dispense Refill  . albuterol (PROVENTIL HFA;VENTOLIN HFA) 108 (90 BASE) MCG/ACT inhaler Inhale 2 puffs into the lungs every 6 (six) hours as  needed for wheezing or shortness of breath. 1 Inhaler 3  . CANNABIDIOL PO cbd oil    . Coenzyme Q10 (COQ-10 PO) Take by mouth.    . linaclotide (LINZESS) 290 MCG CAPS capsule Take 1 capsule (290 mcg total) by mouth daily before breakfast. 30 capsule 2  . lisinopril-hydrochlorothiazide (PRINZIDE,ZESTORETIC) 10-12.5 MG tablet Take 1 tablet by mouth daily. 90 tablet 3  . NADH-ASCORBIC ACID-SOD BICARB PO Take by mouth.    . NONFORMULARY OR COMPOUNDED ITEM Apply 30 mLs topically daily. Bi-Est estriol:estradiol    . OVER THE COUNTER MEDICATION Take  5 mg by mouth daily.     No current facility-administered medications on file prior to visit.    She has No Known Allergies..  Review of Systems Review of Systems  Constitutional: Negative for activity change, appetite change and fatigue.  HENT: Negative for hearing loss, congestion, tinnitus and ear discharge.  dentist q28m Eyes: Negative for visual disturbance (see optho q1y -- vision corrected to 20/20 with glasses).  Respiratory: Negative for cough, chest tightness and shortness of breath.   Cardiovascular: Negative for chest pain, palpitations and leg swelling.  Gastrointestinal: Negative for abdominal pain, diarrhea, constipation and abdominal distention.  Genitourinary: Negative for urgency, frequency, decreased urine volume and difficulty urinating.  Musculoskeletal: Negative for back pain, arthralgias and gait problem.  Skin: Negative for color change, pallor and rash.  Neurological: Negative for dizziness, light-headedness, numbness and headaches.  Hematological: Negative for adenopathy. Does not bruise/bleed easily.  Psychiatric/Behavioral: Negative for suicidal ideas, confusion, sleep disturbance, self-injury, dysphoric mood, decreased concentration and agitation.      Objective:    BP 116/70 (BP Location: Right Arm, Cuff Size: Normal)   Pulse 62   Temp 98.5 F (36.9 C) (Oral)   Resp 12   Ht 5\' 4"  (1.626 m)   Wt 202 lb 12.8 oz (92 kg)   SpO2 98%   BMI 34.81 kg/m  General appearance: alert, cooperative and no distress Head: Normocephalic, without obvious abnormality, atraumatic Eyes: conjunctivae/corneas clear. PERRL, EOM's intact. Fundi benign. Ears: normal TM's and external ear canals both ears Nose: Nares normal. Septum midline. Mucosa normal. No drainage or sinus tenderness. Throat: lips, mucosa, and tongue normal; teeth and gums normal Neck: no adenopathy, no carotid bruit, no JVD, supple, symmetrical, trachea midline and thyroid not enlarged, symmetric, no  tenderness/mass/nodules Back: symmetric, no curvature. ROM normal. No CVA tenderness. Lungs: clear to auscultation bilaterally Breasts: normal appearance, no masses or tenderness Heart: regular rate and rhythm, S1, S2 normal, no murmur, click, rub or gallop Abdomen: soft, non-tender; bowel sounds normal; no masses,  no organomegaly Pelvic: deferred Extremities: extremities normal, atraumatic, no cyanosis or edema Pulses: 2+ and symmetric Skin: Skin color, texture, turgor normal. No rashes or lesions Lymph nodes: Cervical, supraclavicular, and axillary nodes normal. Neurologic: Alert and oriented X 3, normal strength and tone. Normal symmetric reflexes. Normal coordination and gait    Assessment:    Healthy female exam.      Plan:    ghm utd Check labs  See After Visit Summary for Counseling Recommendations    1. Bronchitis Not active now - benzonatate (TESSALON) 100 MG capsule; Take 2 capsules (200 mg total) by mouth 3 (three) times daily as needed for cough.  Dispense: 20 capsule; Refill: 0  2. Depression, unspecified depression type Stable  - escitalopram (LEXAPRO) 20 MG tablet; TAKE ONE (1) TABLET BY MOUTH EVERY DAY  Dispense: 90 tablet; Refill: 1  3. Cold sore   -  valACYclovir (VALTREX) 500 MG tablet; Take 1 tablet (500 mg total) by mouth 2 (two) times daily.  Dispense: 20 tablet; Refill: 5  4. Exercise-induced asthma Stable   5. Muscle spasm stable - tiZANidine (ZANAFLEX) 4 MG tablet; Take 1 tablet (4 mg total) by mouth every 8 (eight) hours as needed.  Dispense: 30 tablet; Refill: 1  6. Mild intermittent asthma without complication  - budesonide-formoterol (SYMBICORT) 80-4.5 MCG/ACT inhaler; Inhale 2 puffs into the lungs 2 (two) times daily.  Dispense: 1 Inhaler; Refill: 3 - montelukast (SINGULAIR) 10 MG tablet; Take 1 tablet (10 mg total) by mouth at bedtime.  Dispense: 30 tablet; Refill: 3  7. Preventative health care See above - Lipid panel - CBC with  Differential/Platelet - TSH - Comprehensive metabolic panel - POCT Urinalysis Dipstick (Automated) - Ambulatory referral to Gastroenterology  8. Essential hypertension Well controlled, no changes to meds. Encouraged heart healthy diet such as the DASH diet and exercise as tolerated.   - Lipid panel - CBC with Differential/Platelet - TSH - Comprehensive metabolic panel - POCT Urinalysis Dipstick (Automated)  9. Acute bilateral low back pain without sciatica   - POCT Urinalysis Dipstick (Automated) - Urine Culture  10. Gastroesophageal reflux disease, esophagitis presence not specified Refill protonix - pantoprazole (PROTONIX) 40 MG tablet; Take 1 tablet (40 mg total) by mouth daily.  Dispense: 30 tablet; Refill: 3

## 2018-05-08 NOTE — Addendum Note (Signed)
Addended by: Kem Boroughs D on: 05/08/2018 03:26 PM   Modules accepted: Orders

## 2018-05-08 NOTE — Patient Instructions (Signed)
Preventive Care 40-64 Years, Female Preventive care refers to lifestyle choices and visits with your health care provider that can promote health and wellness. What does preventive care include?   A yearly physical exam. This is also called an annual well check.  Dental exams once or twice a year.  Routine eye exams. Ask your health care provider how often you should have your eyes checked.  Personal lifestyle choices, including: ? Daily care of your teeth and gums. ? Regular physical activity. ? Eating a healthy diet. ? Avoiding tobacco and drug use. ? Limiting alcohol use. ? Practicing safe sex. ? Taking low-dose aspirin daily starting at age 50. ? Taking vitamin and mineral supplements as recommended by your health care provider. What happens during an annual well check? The services and screenings done by your health care provider during your annual well check will depend on your age, overall health, lifestyle risk factors, and family history of disease. Counseling Your health care provider may ask you questions about your:  Alcohol use.  Tobacco use.  Drug use.  Emotional well-being.  Home and relationship well-being.  Sexual activity.  Eating habits.  Work and work environment.  Method of birth control.  Menstrual cycle.  Pregnancy history. Screening You may have the following tests or measurements:  Height, weight, and BMI.  Blood pressure.  Lipid and cholesterol levels. These may be checked every 5 years, or more frequently if you are over 50 years old.  Skin check.  Lung cancer screening. You may have this screening every year starting at age 55 if you have a 30-pack-year history of smoking and currently smoke or have quit within the past 15 years.  Colorectal cancer screening. All adults should have this screening starting at age 50 and continuing until age 75. Your health care provider may recommend screening at age 45. You will have tests every  1-10 years, depending on your results and the type of screening test. People at increased risk should start screening at an earlier age. Screening tests may include: ? Guaiac-based fecal occult blood testing. ? Fecal immunochemical test (FIT). ? Stool DNA test. ? Virtual colonoscopy. ? Sigmoidoscopy. During this test, a flexible tube with a tiny camera (sigmoidoscope) is used to examine your rectum and lower colon. The sigmoidoscope is inserted through your anus into your rectum and lower colon. ? Colonoscopy. During this test, a long, thin, flexible tube with a tiny camera (colonoscope) is used to examine your entire colon and rectum.  Hepatitis C blood test.  Hepatitis B blood test.  Sexually transmitted disease (STD) testing.  Diabetes screening. This is done by checking your blood sugar (glucose) after you have not eaten for a while (fasting). You may have this done every 1-3 years.  Mammogram. This may be done every 1-2 years. Talk to your health care provider about when you should start having regular mammograms. This may depend on whether you have a family history of breast cancer.  BRCA-related cancer screening. This may be done if you have a family history of breast, ovarian, tubal, or peritoneal cancers.  Pelvic exam and Pap test. This may be done every 3 years starting at age 21. Starting at age 30, this may be done every 5 years if you have a Pap test in combination with an HPV test.  Bone density scan. This is done to screen for osteoporosis. You may have this scan if you are at high risk for osteoporosis. Discuss your test results, treatment options,   and if necessary, the need for more tests with your health care provider. Vaccines Your health care provider may recommend certain vaccines, such as:  Influenza vaccine. This is recommended every year.  Tetanus, diphtheria, and acellular pertussis (Tdap, Td) vaccine. You may need a Td booster every 10 years.  Varicella  vaccine. You may need this if you have not been vaccinated.  Zoster vaccine. You may need this after age 38.  Measles, mumps, and rubella (MMR) vaccine. You may need at least one dose of MMR if you were born in 1957 or later. You may also need a second dose.  Pneumococcal 13-valent conjugate (PCV13) vaccine. You may need this if you have certain conditions and were not previously vaccinated.  Pneumococcal polysaccharide (PPSV23) vaccine. You may need one or two doses if you smoke cigarettes or if you have certain conditions.  Meningococcal vaccine. You may need this if you have certain conditions.  Hepatitis A vaccine. You may need this if you have certain conditions or if you travel or work in places where you may be exposed to hepatitis A.  Hepatitis B vaccine. You may need this if you have certain conditions or if you travel or work in places where you may be exposed to hepatitis B.  Haemophilus influenzae type b (Hib) vaccine. You may need this if you have certain conditions. Talk to your health care provider about which screenings and vaccines you need and how often you need them. This information is not intended to replace advice given to you by your health care provider. Make sure you discuss any questions you have with your health care provider. Document Released: 02/28/2015 Document Revised: 03/24/2017 Document Reviewed: 12/03/2014 Elsevier Interactive Patient Education  2019 [employer].

## 2018-05-09 ENCOUNTER — Encounter: Payer: Self-pay | Admitting: Family Medicine

## 2018-05-09 LAB — URINE CULTURE
MICRO NUMBER: 345326
SPECIMEN QUALITY:: ADEQUATE

## 2018-05-20 ENCOUNTER — Other Ambulatory Visit: Payer: Self-pay | Admitting: Family Medicine

## 2018-05-20 DIAGNOSIS — I10 Essential (primary) hypertension: Secondary | ICD-10-CM

## 2018-05-28 ENCOUNTER — Encounter: Payer: Self-pay | Admitting: Family Medicine

## 2018-05-29 NOTE — Telephone Encounter (Signed)
Would you like for her to come in or is a virtual visit?

## 2018-09-15 ENCOUNTER — Other Ambulatory Visit: Payer: Self-pay | Admitting: Family Medicine

## 2018-09-15 DIAGNOSIS — K219 Gastro-esophageal reflux disease without esophagitis: Secondary | ICD-10-CM

## 2018-11-03 ENCOUNTER — Other Ambulatory Visit: Payer: Self-pay | Admitting: Family Medicine

## 2018-11-03 DIAGNOSIS — N644 Mastodynia: Secondary | ICD-10-CM

## 2018-11-10 ENCOUNTER — Ambulatory Visit: Payer: 59 | Admitting: Family Medicine

## 2018-11-28 ENCOUNTER — Ambulatory Visit
Admission: RE | Admit: 2018-11-28 | Discharge: 2018-11-28 | Disposition: A | Discharge status: Home / Self Care | Payer: 59 | Source: Ambulatory Visit | Attending: Family Medicine | Admitting: Family Medicine

## 2018-11-28 ENCOUNTER — Other Ambulatory Visit: Payer: Self-pay

## 2018-11-28 DIAGNOSIS — N644 Mastodynia: Secondary | ICD-10-CM

## 2019-05-11 ENCOUNTER — Encounter: Payer: 59 | Admitting: Family Medicine

## 2019-06-21 ENCOUNTER — Other Ambulatory Visit: Payer: Self-pay | Admitting: Family Medicine

## 2019-06-21 DIAGNOSIS — B001 Herpesviral vesicular dermatitis: Secondary | ICD-10-CM

## 2020-02-11 IMAGING — CT CT RENAL STONE PROTOCOL
2 of 4 series · 16 of 46 positions shown, 18 images · non-contrast
Comparison: None.

CLINICAL DATA: Acute onset of lower back pain radiating to both
lower extremities. Headache.

EXAM:
CT ABDOMEN AND PELVIS WITHOUT CONTRAST
TECHNIQUE: Multidetector CT imaging of the abdomen and pelvis was performed
following the standard protocol without IV contrast.

[Series 2: axial st · axial · 0.97mm/px · z∈[+539,+959]mm · 13 of 92 slices shown, 15 images]
[im 4/92  soft-tissue]
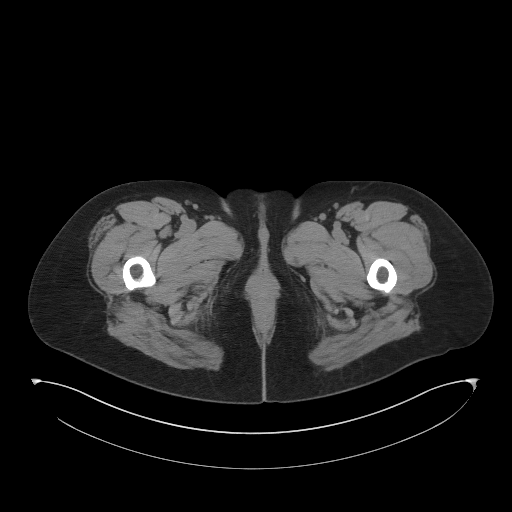
[im 4/92  bone]
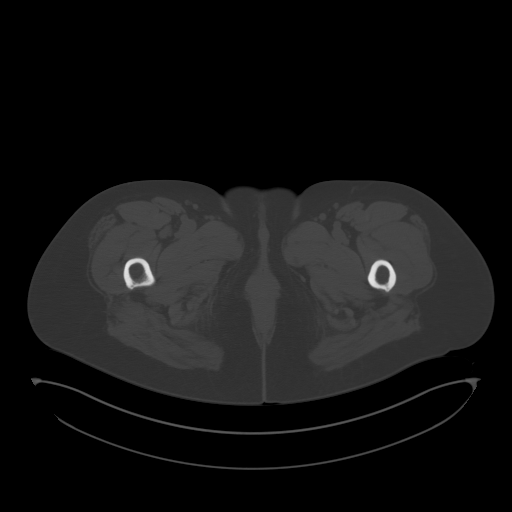
[im 12/92  soft-tissue]
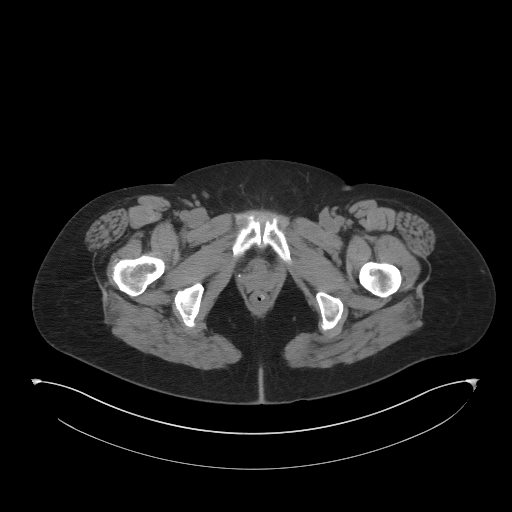
[im 20/92  soft-tissue]
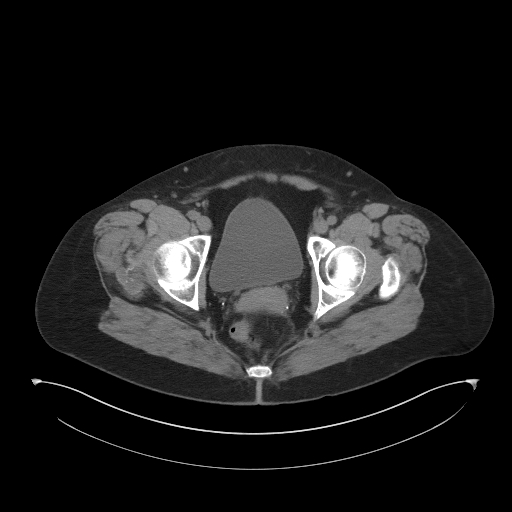
[im 24/92  soft-tissue]
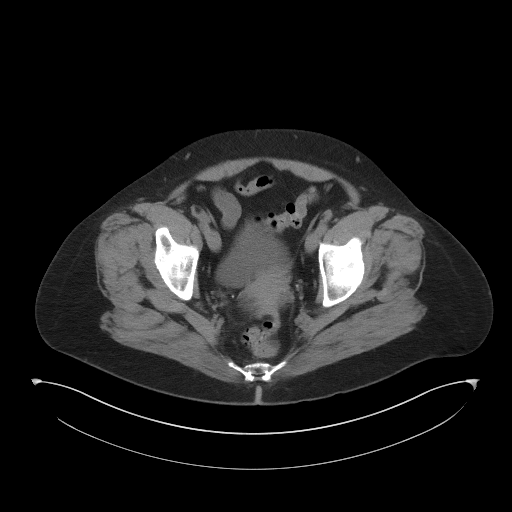
[im 32/92  soft-tissue]
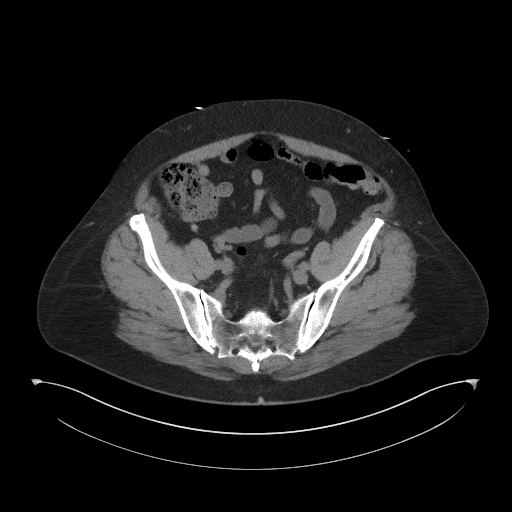
[im 40/92  soft-tissue]
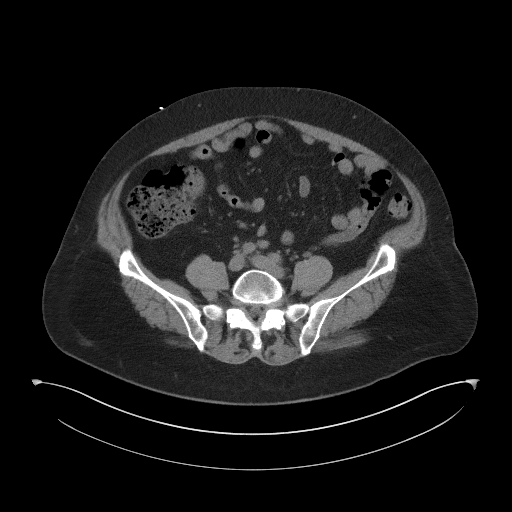
[im 48/92  soft-tissue]
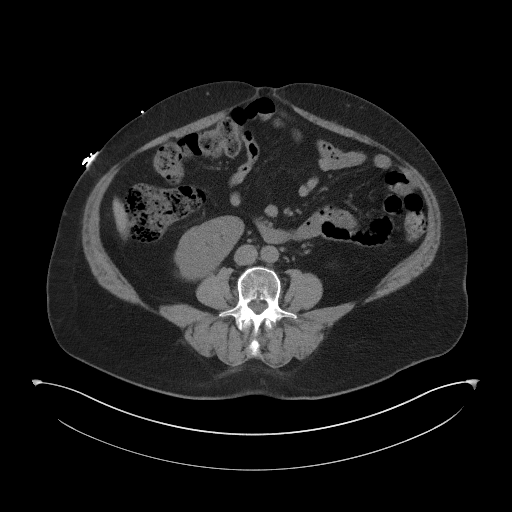
[im 52/92  soft-tissue]
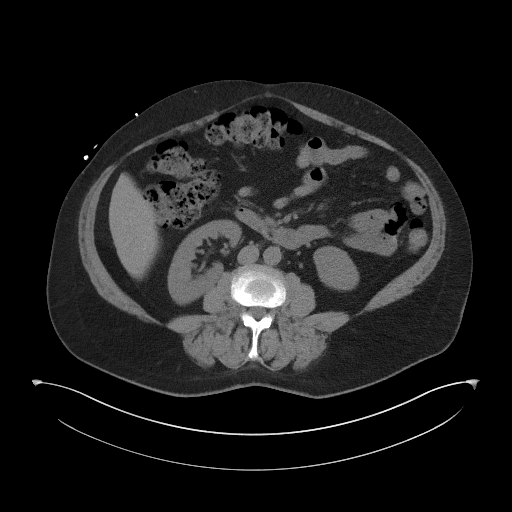
[im 60/92  soft-tissue]
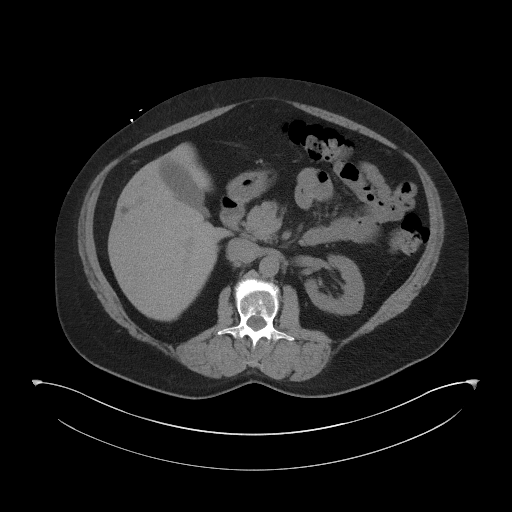
[im 60/92  bone]
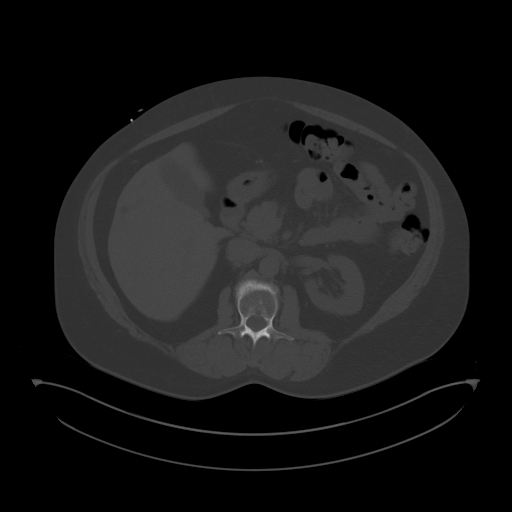
[im 68/92  soft-tissue]
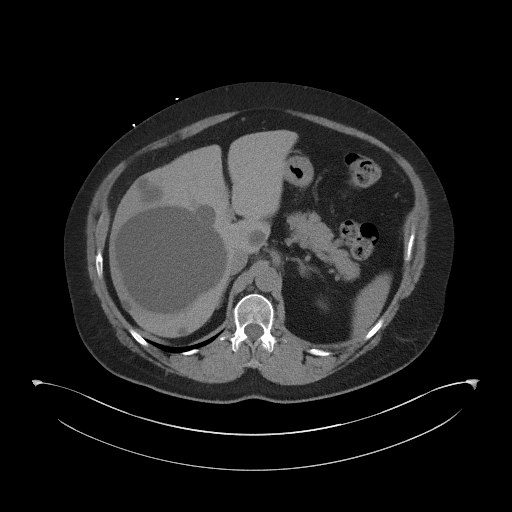
[im 72/92  soft-tissue]
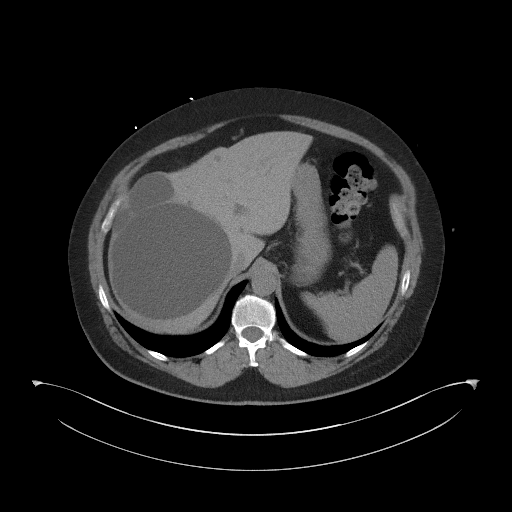
[im 80/92  soft-tissue]
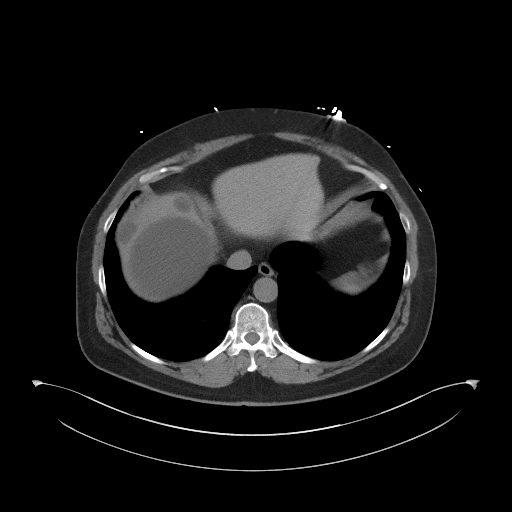
[im 88/92  soft-tissue]
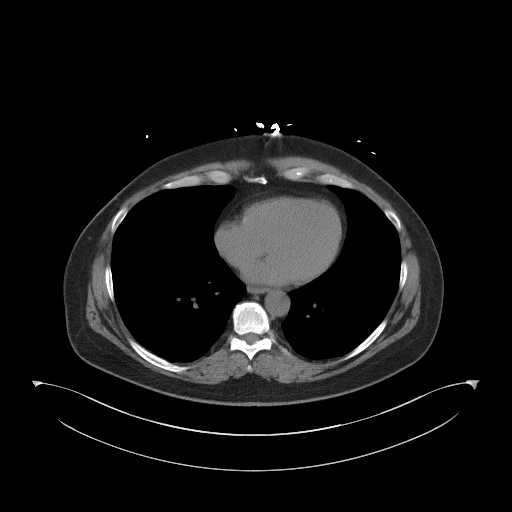

[Series 5: coronal st · coronal · 0.94mm/px · 3 of 100 slices shown]
[im 34/100  soft-tissue]
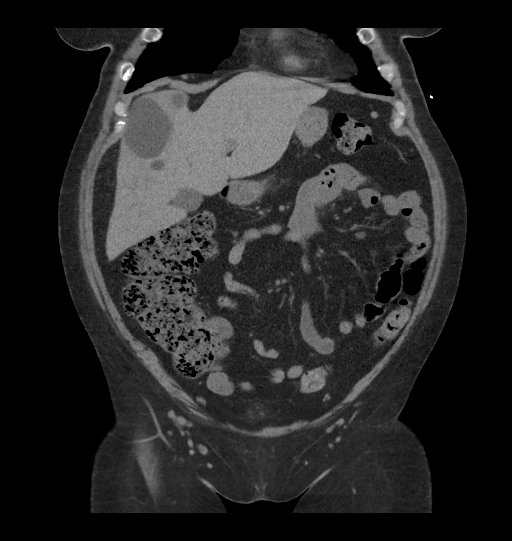
[im 45/100  soft-tissue]
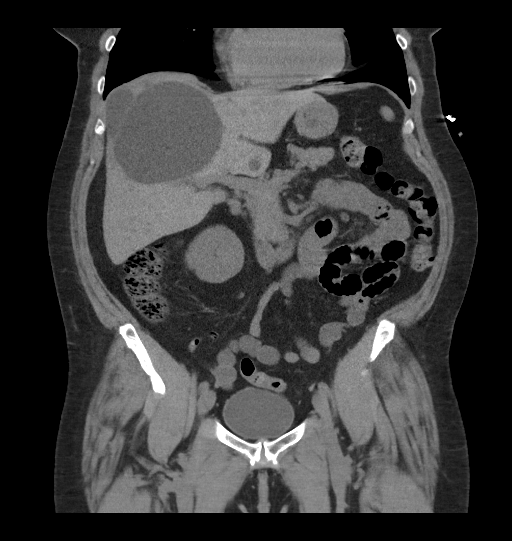
[im 56/100  soft-tissue]
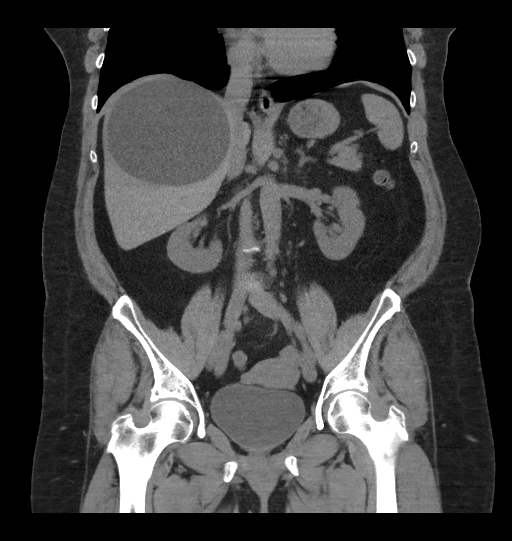

[16 of 46 positions shown; findings below may reference images not displayed]

FINDINGS: Lower chest: The visualized lung bases are grossly clear. The
visualized portions of the mediastinum are unremarkable.

Hepatobiliary: Large hepatic cysts measure up to 12.2 cm in size.
The gallbladder is unremarkable. The common bile duct is normal in
caliber.

Pancreas: The pancreas is within normal limits.

Spleen: The spleen is unremarkable in appearance.

Adrenals/Urinary Tract: The adrenal glands are unremarkable in
appearance. The kidneys are within normal limits. There is no
evidence of hydronephrosis. No renal or ureteral stones are
identified. No perinephric stranding is seen.

Stomach/Bowel: The stomach is unremarkable in appearance. The small
bowel is within normal limits. The appendix is normal in caliber,
without evidence of appendicitis. The colon is unremarkable in
appearance.

Vascular/Lymphatic: The abdominal aorta is unremarkable in
appearance. The inferior vena cava is grossly unremarkable. No
retroperitoneal lymphadenopathy is seen. No pelvic sidewall
lymphadenopathy is identified.

Reproductive: The bladder is mildly distended and grossly
unremarkable. The uterus is unremarkable in appearance. The ovaries
are relatively symmetric. No suspicious adnexal masses are seen.

Other: No additional soft tissue abnormalities are seen.

Musculoskeletal: No acute osseous abnormalities are identified.
Facet disease is noted at the lower lumbar spine. The visualized
musculature is unremarkable in appearance.
IMPRESSION: 1. No acute abnormality seen within the abdomen or pelvis.
2. Large hepatic cysts measure up to 12.2 cm in size.

## 2020-05-26 ENCOUNTER — Other Ambulatory Visit: Payer: Self-pay | Admitting: Family Medicine

## 2020-05-26 DIAGNOSIS — Z1231 Encounter for screening mammogram for malignant neoplasm of breast: Secondary | ICD-10-CM

## 2020-07-15 ENCOUNTER — Ambulatory Visit
Admission: RE | Admit: 2020-07-15 | Discharge: 2020-07-15 | Disposition: A | Discharge status: Home / Self Care | Payer: 59 | Source: Ambulatory Visit | Attending: Family Medicine | Admitting: Family Medicine

## 2020-07-15 ENCOUNTER — Other Ambulatory Visit: Payer: Self-pay

## 2020-07-15 DIAGNOSIS — Z1231 Encounter for screening mammogram for malignant neoplasm of breast: Secondary | ICD-10-CM

## 2020-12-04 ENCOUNTER — Other Ambulatory Visit: Payer: Self-pay

## 2020-12-04 ENCOUNTER — Ambulatory Visit
Admission: RE | Admit: 2020-12-04 | Discharge: 2020-12-04 | Disposition: A | Discharge status: Home / Self Care | Payer: 59 | Source: Ambulatory Visit | Attending: Family Medicine | Admitting: Family Medicine

## 2022-01-17 ENCOUNTER — Other Ambulatory Visit: Payer: Self-pay

## 2022-01-17 ENCOUNTER — Emergency Department (HOSPITAL_BASED_OUTPATIENT_CLINIC_OR_DEPARTMENT_OTHER)
Admission: EM | Admit: 2022-01-17 | Discharge: 2022-01-17 | Disposition: A | Discharge status: Home / Self Care | Payer: 59 | Attending: Emergency Medicine | Admitting: Emergency Medicine

## 2022-01-17 ENCOUNTER — Encounter (HOSPITAL_BASED_OUTPATIENT_CLINIC_OR_DEPARTMENT_OTHER): Payer: Self-pay | Admitting: Emergency Medicine

## 2022-01-17 DIAGNOSIS — J45909 Unspecified asthma, uncomplicated: Secondary | ICD-10-CM | POA: Insufficient documentation

## 2022-01-17 DIAGNOSIS — I10 Essential (primary) hypertension: Secondary | ICD-10-CM | POA: Insufficient documentation

## 2022-01-17 DIAGNOSIS — Z7951 Long term (current) use of inhaled steroids: Secondary | ICD-10-CM | POA: Insufficient documentation

## 2022-01-17 DIAGNOSIS — G8929 Other chronic pain: Secondary | ICD-10-CM | POA: Insufficient documentation

## 2022-01-17 DIAGNOSIS — E119 Type 2 diabetes mellitus without complications: Secondary | ICD-10-CM | POA: Insufficient documentation

## 2022-01-17 DIAGNOSIS — Z79899 Other long term (current) drug therapy: Secondary | ICD-10-CM | POA: Diagnosis not present

## 2022-01-17 DIAGNOSIS — M545 Low back pain, unspecified: Secondary | ICD-10-CM | POA: Insufficient documentation

## 2022-01-17 MED ORDER — PREDNISONE 10 MG PO TABS: 40.0000 mg | ORAL_TABLET | Freq: Every day | ORAL | 0 refills | Status: AC

## 2022-01-17 MED ORDER — OXYCODONE-ACETAMINOPHEN 5-325 MG PO TABS
1.0000 | ORAL_TABLET | Freq: Once | ORAL | Status: AC
  Administered 2022-01-17: 1 via ORAL
  Filled 2022-01-17: qty 1

## 2022-01-17 MED ORDER — DEXAMETHASONE 4 MG PO TABS
4.0000 mg | ORAL_TABLET | Freq: Once | ORAL | Status: AC
  Administered 2022-01-17: 4 mg via ORAL
  Filled 2022-01-17: qty 1

## 2022-01-17 MED ORDER — IBUPROFEN 800 MG PO TABS: 800.0000 mg | ORAL_TABLET | Freq: Three times a day (TID) | ORAL | 0 refills | Status: AC

## 2022-01-17 NOTE — ED Notes (Signed)
Pt ambulated to restroom. 

## 2022-01-17 NOTE — ED Provider Notes (Signed)
[facility] EMERGENCY DEPARTMENT Provider Note   CSN: [id number] Arrival date & time: 01/17/22  1020     History  Chief Complaint  Patient presents with   Back Pain    Lacey Johns is a 66 y.o. female.  With a history of spinal stenosis, asthma, hypertension, diabetes who presents ED for evaluation of low back pain.  She has a chronic history of low back pain and states that it stems from her spinal stenosis.  She has chronic neurologic symptoms including intermittent tingling of bilateral hands and feet.  Has had numerous workups for this including MRI in August of this year.  She sees neurology on Friday and reported that they believe she needs surgery.  She does not have a follow-up appointment scheduled yet, states she needs to call to schedule I.  She states that the pain got worse on Saturday and her legs buckled this morning.  She states she occasionally falls secondary to her spinal stenosis.  She presents today for pain management.  She states she has hydrocodone at home and tizanidine, however does not like to take opioid pain medicine as she does not like the way it makes her feel.  Tizanidine did not help last night.  She was able to sleep through the night.  Denies saddle paresthesias, urinary or fecal incontinence, fevers, injection drug use.  Rates the pain at an 8 out of 10.  Pain is currently on her right lower back with radiation down to both legs, however often rotates to the left side.  She typically gets steroid injections in her lumbar spine for symptoms, which is what she was hoping to get today.   Back Pain      Home Medications Prior to Admission medications   Medication Sig Start Date End Date Taking? Authorizing Provider  albuterol (PROVENTIL HFA;VENTOLIN HFA) 108 (90 BASE) MCG/ACT inhaler Inhale 2 puffs into the lungs every 6 (six) hours as needed for wheezing or shortness of breath. 09/16/14   Ann Held, DO  benzonatate (TESSALON) 100 MG  capsule Take 2 capsules (200 mg total) by mouth 3 (three) times daily as needed for cough. 05/08/18   Ann Held, DO  budesonide-formoterol (SYMBICORT) 80-4.5 MCG/ACT inhaler Inhale 2 puffs into the lungs 2 (two) times daily. 05/08/18   Ann Held, DO  CANNABIDIOL PO cbd oil    [provider]  Coenzyme Q10 (COQ-10 PO) Take by mouth.    [provider]  escitalopram (LEXAPRO) 20 MG tablet TAKE ONE (1) TABLET BY MOUTH EVERY DAY 05/08/18   Carollee Herter, Alferd Apa, DO  linaclotide [facility]) 290 MCG CAPS capsule Take 1 capsule (290 mcg total) by mouth daily before breakfast. 05/06/17   Carollee Herter, Alferd Apa, DO  lisinopril-hydrochlorothiazide (PRINZIDE,ZESTORETIC) 10-12.5 MG tablet TAKE ONE (1) TABLET BY MOUTH EVERY DAY 05/23/18   Carollee Herter, Yvonne R, DO  montelukast (SINGULAIR) 10 MG tablet Take 1 tablet (10 mg total) by mouth at bedtime. 05/08/18   Roma Schanz R, DO  NADH-ASCORBIC ACID-SOD BICARB PO Take by mouth.    [provider]  NONFORMULARY OR COMPOUNDED ITEM Apply 30 mLs topically daily. Bi-Est estriol:estradiol    [provider]  OVER THE COUNTER MEDICATION Take 5 mg by mouth daily.    [provider]  pantoprazole (PROTONIX) 40 MG tablet TAKE 1 TABLET BY MOUTH DAILY 09/15/18   Carollee Herter, Yvonne R, DO  tiZANidine (ZANAFLEX) 4 MG tablet Take  1 tablet (4 mg total) by mouth every 8 (eight) hours as needed. 05/08/18   Carollee Herter, Alferd Apa, DO  valACYclovir (VALTREX) 500 MG tablet TAKE ONE (1) TABLET BY MOUTH TWO (2) TIMES DAILY 06/21/19   Ann Held, DO      Allergies    Patient has no known allergies.    Review of Systems   Review of Systems  Musculoskeletal:  Positive for back pain.  All other systems reviewed and are negative.   Physical Exam Updated Vital Signs BP 138/72   Pulse 85   Temp 98.4 F (36.9 C) (Oral)   Resp 20   Ht '5\' 4"'$  (1.626 m)   Wt 72.6 kg   SpO2 96%   BMI 27.46 kg/m  Physical  Exam Vitals and nursing note reviewed.  Constitutional:      General: She is not in acute distress.    Appearance: She is well-developed.  HENT:     Head: Normocephalic and atraumatic.  Eyes:     Conjunctiva/sclera: Conjunctivae normal.  Cardiovascular:     Rate and Rhythm: Normal rate and regular rhythm.     Heart sounds: No murmur heard. Pulmonary:     Effort: Pulmonary effort is normal. No respiratory distress.     Breath sounds: Normal breath sounds.  Abdominal:     Palpations: Abdomen is soft.     Tenderness: There is no abdominal tenderness.  Musculoskeletal:        General: No swelling.     Cervical back: Neck supple.     Comments: Straight leg raise positive bilaterally  Skin:    General: Skin is warm and dry.     Capillary Refill: Capillary refill takes less than 2 seconds.  Neurological:     General: No focal deficit present.     Mental Status: She is alert and oriented to person, place, and time.     Sensory: No sensory deficit.  Psychiatric:        Mood and Affect: Mood normal.    ED Results / Procedures / Treatments   Labs (all labs ordered are listed, but only abnormal results are displayed) Labs Reviewed - No data to display  EKG None  Radiology No results found.  Procedures Procedures    Medications Ordered in ED Medications  dexamethasone (DECADRON) tablet 4 mg (4 mg Oral Given 01/17/22 1302)  oxyCODONE-acetaminophen (PERCOCET/ROXICET) 5-325 MG per tablet 1 tablet (1 tablet Oral Given 01/17/22 1302)    ED Course/ Medical Decision Making/ A&P Clinical Course as of 01/17/22 1430  Sun Jan 17, 2022  1343 Pain is improved.  Will attempt ambulation [AS]    Clinical Course User Index [AS] Claudie Fisherman Grafton Folk, PA-C                           Medical Decision Making Risk Prescription drug management.  This patient presents to the ED for concern of back pain, this involves an extensive number of treatment options, and is a complaint that carries  with it a high risk of complications and morbidity.  The emergent differential diagnosis for back pain includes but is not limited to fracture, muscle strain, cauda equina, spinal stenosis. DDD, ankylosing spondylitis, acute ligamentous injury, disk herniation, spondylolisthesis, Epidural compression syndrome, metastatic cancer, transverse myelitis, vertebral osteomyelitis, diskitis, kidney stone, pyelonephritis, AAA, Perforated ulcer, Retrocecal appendicitis, pancreatitis, bowel obstruction, retroperitoneal hemorrhage or mass, meningitis.    Co morbidities that complicate the patient  evaluation   spinal stenosis, chronic back pain  My initial workup includes Decadron, pain control  Additional history obtained from: Nursing notes from this visit. Previous records within EMR system neurosurgery office visit on 12/28/2021 where she rated her pain at a 9 out of 10 in her neck and low back, however states that she has 70% relief from the procedure.  Reported intermittent buckling of her lower extremities at that time.  I ordered imaging studies including not needed   Afebrile, hemodynamically stable.  66 year old female presents ED for evaluation of acute on chronic back pain.  She has had numerous workups including MRIs recently for this.  She does see neurosurgery.  States that she typically gets relief from corticosteroid injections and is hoping for this today.  She was informed that we do not do lumbar steroid injections.  Will start patient prednisone after initial dose of Decadron.  Will send patient prescription strength ibuprofen as she has Norco at home and does not like to take opioids.  Physical exam remarkable for positive bilateral straight leg raise.  Patient ambulated in the ED without difficulty after pain medication and Decadron.  She will call tomorrow to schedule an appointment with her neurosurgeon to determine definitive management.  Patient does not have any cord compression  symptoms.  I have low suspicion for acute emergencies as the cause of her pain.  Gave patient strict return precautions.  Stable at discharge.  At this time there does not appear to be any evidence of an acute emergency medical condition and the patient appears stable for discharge with appropriate outpatient follow up. Diagnosis was discussed with patient who verbalizes understanding of care plan and is agreeable to discharge. I have discussed return precautions with patient and daughter in law who verbalizes understanding. Patient encouraged to follow-up with their PCP within 1 week. All questions answered.  Note: Portions of this report may have been transcribed using voice recognition software. Every effort was made to ensure accuracy; however, inadvertent computerized transcription errors may still be present.          Final Clinical Impression(s) / ED Diagnoses Final diagnoses:  None    Rx / DC Orders ED Discharge Orders     None         Roylene Reason, PA-C 01/17/22 1608    Audley Hose, MD 01/18/22 1404

## 2022-01-17 NOTE — Discharge Instructions (Signed)
You have been seen today for your complaint of low back pain. Your discharge medications include prednisone.  This is a steroid.  You should take it as prescribed. Ibuprofen and Tylenol.  You may alternate these every 4 hours.  You may take up to 800 mg of ibuprofen and up to 1000 mg of Tylenol at a time. Home care instructions are as follows:  Performed the sciatica rehab exercises listed in this packet. Follow up with: Your neurosurgeon as soon as possible. Please seek immediate medical care if you develop any of the following symptoms: You are not able to control when you urinate or have bowel movements (incontinence). You have: Weakness in your lower back, pelvis, buttocks, or legs that gets worse. Redness or swelling of your back. A burning sensation when you urinate. At this time there does not appear to be the presence of an emergent medical condition, however there is always the potential for conditions to change. Please read and follow the below instructions.  Do not take your medicine if  develop an itchy rash, swelling in your mouth or lips, or difficulty breathing; call 911 and seek immediate emergency medical attention if this occurs.  You may review your lab tests and imaging results in their entirety on your MyChart account.  Please discuss all results of fully with your primary care provider and other specialist at your follow-up visit.  Note: Portions of this text may have been transcribed using voice recognition software. Every effort was made to ensure accuracy; however, inadvertent computerized transcription errors may still be present.

## 2022-01-17 NOTE — ED Triage Notes (Signed)
Pt c/o lower back pain, hx of stenosis, endprses fall when getting out of bed this morning.

## 2022-02-23 ENCOUNTER — Other Ambulatory Visit: Payer: Self-pay | Admitting: Family Medicine

## 2022-02-23 DIAGNOSIS — Z780 Asymptomatic menopausal state: Secondary | ICD-10-CM

## 2022-10-19 DIAGNOSIS — U071 COVID-19: Secondary | ICD-10-CM | POA: Diagnosis not present

## 2022-11-05 DIAGNOSIS — L245 Irritant contact dermatitis due to other chemical products: Secondary | ICD-10-CM | POA: Diagnosis not present

## 2022-11-05 DIAGNOSIS — D225 Melanocytic nevi of trunk: Secondary | ICD-10-CM | POA: Diagnosis not present

## 2022-11-05 DIAGNOSIS — L700 Acne vulgaris: Secondary | ICD-10-CM | POA: Diagnosis not present

## 2022-11-05 DIAGNOSIS — L821 Other seborrheic keratosis: Secondary | ICD-10-CM | POA: Diagnosis not present

## 2022-11-05 DIAGNOSIS — D485 Neoplasm of uncertain behavior of skin: Secondary | ICD-10-CM | POA: Diagnosis not present

## 2022-11-10 DIAGNOSIS — R21 Rash and other nonspecific skin eruption: Secondary | ICD-10-CM | POA: Diagnosis not present

## 2022-11-10 DIAGNOSIS — Z23 Encounter for immunization: Secondary | ICD-10-CM | POA: Diagnosis not present

## 2022-11-23 DIAGNOSIS — M5412 Radiculopathy, cervical region: Secondary | ICD-10-CM | POA: Diagnosis not present

## 2022-12-06 DIAGNOSIS — M5412 Radiculopathy, cervical region: Secondary | ICD-10-CM | POA: Diagnosis not present

## 2022-12-06 DIAGNOSIS — M47812 Spondylosis without myelopathy or radiculopathy, cervical region: Secondary | ICD-10-CM | POA: Diagnosis not present

## 2022-12-06 DIAGNOSIS — M4802 Spinal stenosis, cervical region: Secondary | ICD-10-CM | POA: Diagnosis not present

## 2022-12-06 DIAGNOSIS — M5030 Other cervical disc degeneration, unspecified cervical region: Secondary | ICD-10-CM | POA: Diagnosis not present

## 2022-12-30 DIAGNOSIS — F325 Major depressive disorder, single episode, in full remission: Secondary | ICD-10-CM | POA: Diagnosis not present

## 2022-12-30 DIAGNOSIS — K219 Gastro-esophageal reflux disease without esophagitis: Secondary | ICD-10-CM | POA: Diagnosis not present

## 2022-12-30 DIAGNOSIS — E1169 Type 2 diabetes mellitus with other specified complication: Secondary | ICD-10-CM | POA: Diagnosis not present

## 2022-12-30 DIAGNOSIS — I10 Essential (primary) hypertension: Secondary | ICD-10-CM | POA: Diagnosis not present

## 2022-12-30 DIAGNOSIS — J4520 Mild intermittent asthma, uncomplicated: Secondary | ICD-10-CM | POA: Diagnosis not present

## 2022-12-30 DIAGNOSIS — G4733 Obstructive sleep apnea (adult) (pediatric): Secondary | ICD-10-CM | POA: Diagnosis not present

## 2022-12-30 DIAGNOSIS — Z01818 Encounter for other preprocedural examination: Secondary | ICD-10-CM | POA: Diagnosis not present

## 2022-12-30 DIAGNOSIS — E785 Hyperlipidemia, unspecified: Secondary | ICD-10-CM | POA: Diagnosis not present

## 2022-12-30 DIAGNOSIS — R21 Rash and other nonspecific skin eruption: Secondary | ICD-10-CM | POA: Diagnosis not present

## 2022-12-30 DIAGNOSIS — R54 Age-related physical debility: Secondary | ICD-10-CM | POA: Diagnosis not present

## 2023-01-18 DIAGNOSIS — Z981 Arthrodesis status: Secondary | ICD-10-CM | POA: Diagnosis not present

## 2023-01-18 DIAGNOSIS — E119 Type 2 diabetes mellitus without complications: Secondary | ICD-10-CM | POA: Diagnosis not present

## 2023-01-18 DIAGNOSIS — I10 Essential (primary) hypertension: Secondary | ICD-10-CM | POA: Diagnosis not present

## 2023-01-18 DIAGNOSIS — E785 Hyperlipidemia, unspecified: Secondary | ICD-10-CM | POA: Diagnosis not present

## 2023-01-18 DIAGNOSIS — M4802 Spinal stenosis, cervical region: Secondary | ICD-10-CM | POA: Diagnosis not present

## 2023-01-18 DIAGNOSIS — M5412 Radiculopathy, cervical region: Secondary | ICD-10-CM | POA: Diagnosis not present

## 2023-01-18 DIAGNOSIS — G4730 Sleep apnea, unspecified: Secondary | ICD-10-CM | POA: Diagnosis not present

## 2023-01-18 DIAGNOSIS — J4520 Mild intermittent asthma, uncomplicated: Secondary | ICD-10-CM | POA: Diagnosis not present

## 2023-01-18 DIAGNOSIS — K219 Gastro-esophageal reflux disease without esophagitis: Secondary | ICD-10-CM | POA: Diagnosis not present

## 2023-01-18 DIAGNOSIS — E669 Obesity, unspecified: Secondary | ICD-10-CM | POA: Diagnosis not present

## 2023-01-31 DIAGNOSIS — M5030 Other cervical disc degeneration, unspecified cervical region: Secondary | ICD-10-CM | POA: Diagnosis not present

## 2023-01-31 DIAGNOSIS — Z9889 Other specified postprocedural states: Secondary | ICD-10-CM | POA: Diagnosis not present

## 2023-01-31 DIAGNOSIS — Z09 Encounter for follow-up examination after completed treatment for conditions other than malignant neoplasm: Secondary | ICD-10-CM | POA: Diagnosis not present

## 2023-01-31 DIAGNOSIS — M5412 Radiculopathy, cervical region: Secondary | ICD-10-CM | POA: Diagnosis not present

## 2023-02-03 ENCOUNTER — Other Ambulatory Visit: Payer: Self-pay

## 2023-02-03 ENCOUNTER — Encounter (HOSPITAL_BASED_OUTPATIENT_CLINIC_OR_DEPARTMENT_OTHER): Payer: Self-pay | Admitting: *Deleted

## 2023-02-03 ENCOUNTER — Emergency Department (HOSPITAL_BASED_OUTPATIENT_CLINIC_OR_DEPARTMENT_OTHER): Payer: Medicare HMO

## 2023-02-03 ENCOUNTER — Emergency Department (HOSPITAL_BASED_OUTPATIENT_CLINIC_OR_DEPARTMENT_OTHER)
Admission: EM | Admit: 2023-02-03 | Discharge: 2023-02-03 | Disposition: A | Discharge status: Home / Self Care | Payer: Medicare HMO | Attending: Emergency Medicine | Admitting: Emergency Medicine

## 2023-02-03 DIAGNOSIS — K7689 Other specified diseases of liver: Secondary | ICD-10-CM | POA: Diagnosis not present

## 2023-02-03 DIAGNOSIS — R112 Nausea with vomiting, unspecified: Secondary | ICD-10-CM | POA: Diagnosis not present

## 2023-02-03 DIAGNOSIS — R1013 Epigastric pain: Secondary | ICD-10-CM | POA: Diagnosis present

## 2023-02-03 DIAGNOSIS — K824 Cholesterolosis of gallbladder: Secondary | ICD-10-CM | POA: Insufficient documentation

## 2023-02-03 DIAGNOSIS — R1011 Right upper quadrant pain: Secondary | ICD-10-CM | POA: Diagnosis not present

## 2023-02-03 DIAGNOSIS — I10 Essential (primary) hypertension: Secondary | ICD-10-CM | POA: Diagnosis not present

## 2023-02-03 LAB — URINALYSIS, MICROSCOPIC (REFLEX)

## 2023-02-03 LAB — URINALYSIS, ROUTINE W REFLEX MICROSCOPIC
Bilirubin Urine: NEGATIVE
Glucose, UA: >=500 mg/dL — AB
Ketones, ur: 80 mg/dL — AB
Leukocytes,Ua: NEGATIVE
Nitrite: NEGATIVE
Protein, ur: 100 mg/dL — AB
Specific Gravity, Urine: 1.015 (ref 1.005–1.030)
pH: >=9 (ref 5.0–8.0)

## 2023-02-03 LAB — CBC
HCT: 44.1 % (ref 36.0–46.0)
Hemoglobin: 15.1 g/dL — ABNORMAL HIGH (ref 12.0–15.0)
MCH: 31 pg (ref 26.0–34.0)
MCHC: 34.2 g/dL (ref 30.0–36.0)
MCV: 90.6 fL (ref 80.0–100.0)
Platelets: 435 10*3/uL — ABNORMAL HIGH (ref 150–400)
RBC: 4.87 MIL/uL (ref 3.87–5.11)
RDW: 13.5 % (ref 11.5–15.5)
WBC: 11.9 10*3/uL — ABNORMAL HIGH (ref 4.0–10.5)
nRBC: 0 % (ref 0.0–0.2)

## 2023-02-03 LAB — COMPREHENSIVE METABOLIC PANEL
ALT: 22 U/L (ref 0–44)
AST: 26 U/L (ref 15–41)
Albumin: 4.2 g/dL (ref 3.5–5.0)
Alkaline Phosphatase: 112 U/L (ref 38–126)
Anion gap: 12 (ref 5–15)
BUN: 16 mg/dL (ref 8–23)
CO2: 22 mmol/L (ref 22–32)
Calcium: 9.8 mg/dL (ref 8.9–10.3)
Chloride: 102 mmol/L (ref 98–111)
Creatinine, Ser: 0.78 mg/dL (ref 0.44–1.00)
GFR, Estimated: >60 mL/min (ref >60)
Glucose, Bld: 253 mg/dL — ABNORMAL HIGH (ref 70–99)
Potassium: 3.3 mmol/L — ABNORMAL LOW (ref 3.5–5.1)
Sodium: 136 mmol/L (ref 135–145)
Total Bilirubin: 1.1 mg/dL (ref <1.2)
Total Protein: 7.7 g/dL (ref 6.5–8.1)

## 2023-02-03 LAB — LIPASE, BLOOD: Lipase: 56 U/L — ABNORMAL HIGH (ref 11–51)

## 2023-02-03 MED ORDER — PROCHLORPERAZINE EDISYLATE 10 MG/2ML IJ SOLN
10.0000 mg | INTRAMUSCULAR | Status: AC
  Administered 2023-02-03: 10 mg via INTRAVENOUS
  Filled 2023-02-03: qty 2

## 2023-02-03 MED ORDER — PROCHLORPERAZINE MALEATE 10 MG PO TABS: 10.0000 mg | ORAL_TABLET | Freq: Two times a day (BID) | ORAL | 0 refills | Status: AC | PRN

## 2023-02-03 MED ORDER — ONDANSETRON 4 MG PO TBDP
4.0000 mg | ORAL_TABLET | Freq: Once | ORAL | Status: AC | PRN
  Administered 2023-02-03: 4 mg via ORAL
  Filled 2023-02-03: qty 1

## 2023-02-03 MED ORDER — POTASSIUM CHLORIDE CRYS ER 20 MEQ PO TBCR
40.0000 meq | EXTENDED_RELEASE_TABLET | ORAL | Status: AC
  Administered 2023-02-03: 40 meq via ORAL
  Filled 2023-02-03: qty 2

## 2023-02-03 MED ORDER — DIPHENHYDRAMINE HCL 50 MG/ML IJ SOLN
25.0000 mg | Freq: Once | INTRAMUSCULAR | Status: AC
  Administered 2023-02-03: 25 mg via INTRAVENOUS
  Filled 2023-02-03: qty 1

## 2023-02-03 MED ORDER — DROPERIDOL 2.5 MG/ML IJ SOLN
1.2500 mg | Freq: Once | INTRAMUSCULAR | Status: AC
  Administered 2023-02-03: 1.25 mg via INTRAVENOUS
  Filled 2023-02-03: qty 2

## 2023-02-03 NOTE — Discharge Instructions (Signed)
You were seen for your nausea and vomiting in the emergency department.  It is likely from a stomach bug or from Trulicity.  At home, please take the Compazine for your nausea and vomiting.  Please refrain from smoking marijuana since it can cause nausea and vomiting as well.  Check your MyChart online for the results of any tests that had not resulted by the time you left the emergency department.   Follow-up with your primary doctor in 2-3 days regarding your visit.  Talk to them about your gallbladder polyps that were found today and repeat ultrasound in 6 months.  Return immediately to the emergency department if you experience any of the following: Worsening pain, recurrent vomiting, palpitations or fainting, or any other concerning symptoms.    Thank you for visiting our Emergency Department. It was a pleasure taking care of you today.

## 2023-02-03 NOTE — ED Notes (Signed)
Pt alert and oriented X 4 at the time of discharge. RR even and unlabored. No acute distress noted. Pt verbalized understanding of discharge instructions as discussed. Pt ambulatory to lobby at time of discharge.

## 2023-02-03 NOTE — ED Provider Notes (Signed)
[facility] EMERGENCY DEPARTMENT AT [facility] Provider Note   CSN: [id number] Arrival date & time: 02/03/23  1050     History  Chief Complaint  Patient presents with   Nausea   Abdominal Pain    Lacey Johns is a 67 y.o. female.  67 year old female with a history of spinal stenosis status post discectomy from C6-T1 and diabetes who presents to the emergency department with abdominal pain.  Says that after her neck surgery she was off of her Trulicity and took it again on Monday.  Says that since then has been having vomiting and dry heaving.  Has started developing right upper abdominal pain that is 10/10 in severity.  Worsened with eating food and position.  Radiates to her back.  No abdominal surgeries.  Smokes marijuana every day but no alcohol or other recreational drug use.  Tried Zofran at home without improvement of her symptoms.       Home Medications Prior to Admission medications   Medication Sig Start Date End Date Taking? Authorizing Provider  prochlorperazine (COMPAZINE) 10 MG tablet Take 1 tablet (10 mg total) by mouth 2 (two) times daily as needed for nausea or vomiting. 02/03/23  Yes Rondel Baton, MD  albuterol (PROVENTIL HFA;VENTOLIN HFA) 108 (90 BASE) MCG/ACT inhaler Inhale 2 puffs into the lungs every 6 (six) hours as needed for wheezing or shortness of breath. 09/16/14   Donato Schultz, DO  benzonatate (TESSALON) 100 MG capsule Take 2 capsules (200 mg total) by mouth 3 (three) times daily as needed for cough. 05/08/18   Donato Schultz, DO  budesonide-formoterol (SYMBICORT) 80-4.5 MCG/ACT inhaler Inhale 2 puffs into the lungs 2 (two) times daily. 05/08/18   Donato Schultz, DO  CANNABIDIOL PO cbd oil    [provider]  Coenzyme Q10 (COQ-10 PO) Take by mouth.    [provider]  escitalopram (LEXAPRO) 20 MG tablet TAKE ONE (1) TABLET BY MOUTH EVERY DAY 05/08/18   Zola Button, Grayling Congress, DO  ibuprofen (ADVIL)  800 MG tablet Take 1 tablet (800 mg total) by mouth 3 (three) times daily. 01/17/22   Schutt, Edsel Petrin, PA-C  linaclotide [facility]) 290 MCG CAPS capsule Take 1 capsule (290 mcg total) by mouth daily before breakfast. 05/06/17   Zola Button, Grayling Congress, DO  lisinopril-hydrochlorothiazide (PRINZIDE,ZESTORETIC) 10-12.5 MG tablet TAKE ONE (1) TABLET BY MOUTH EVERY DAY 05/23/18   Zola Button, Yvonne R, DO  montelukast (SINGULAIR) 10 MG tablet Take 1 tablet (10 mg total) by mouth at bedtime. 05/08/18   Seabron Spates R, DO  NADH-ASCORBIC ACID-SOD BICARB PO Take by mouth.    [provider]  NONFORMULARY OR COMPOUNDED ITEM Apply 30 mLs topically daily. Bi-Est estriol:estradiol    [provider]  OVER THE COUNTER MEDICATION Take 5 mg by mouth daily.    [provider]  pantoprazole (PROTONIX) 40 MG tablet TAKE 1 TABLET BY MOUTH DAILY 09/15/18   Zola Button, Grayling Congress, DO  tiZANidine (ZANAFLEX) 4 MG tablet Take 1 tablet (4 mg total) by mouth every 8 (eight) hours as needed. 05/08/18   Zola Button, Grayling Congress, DO  valACYclovir (VALTREX) 500 MG tablet TAKE ONE (1) TABLET BY MOUTH TWO (2) TIMES DAILY 06/21/19   Donato Schultz, DO      Allergies    Patient has no known allergies.    Review of Systems   Review of Systems  Physical Exam Updated Vital Signs  BP (!) 153/122   Pulse 61   Temp 97.8 F (36.6 C) (Oral)   Resp 16   Wt 68 kg   SpO2 100%   BMI 25.75 kg/m  Physical Exam Vitals and nursing note reviewed.  Constitutional:      General: She is not in acute distress.    Appearance: She is well-developed.  HENT:     Head: Normocephalic and atraumatic.     Right Ear: External ear normal.     Left Ear: External ear normal.     Nose: Nose normal.  Eyes:     Extraocular Movements: Extraocular movements intact.     Conjunctiva/sclera: Conjunctivae normal.     Pupils: Pupils are equal, round, and reactive to light.  Cardiovascular:     Rate and Rhythm: Normal rate  and regular rhythm.  Pulmonary:     Effort: Pulmonary effort is normal. No respiratory distress.  Abdominal:     General: Abdomen is flat. There is no distension.     Palpations: Abdomen is soft. There is no mass.     Tenderness: There is abdominal tenderness (Epigastrium and right and left upper abdominal). There is no guarding.  Musculoskeletal:     Cervical back: Normal range of motion and neck supple.     Right lower leg: No edema.     Left lower leg: No edema.  Skin:    General: Skin is warm and dry.  Neurological:     Mental Status: She is alert and oriented to person, place, and time. Mental status is at baseline.  Psychiatric:        Mood and Affect: Mood normal.     ED Results / Procedures / Treatments   Labs (all labs ordered are listed, but only abnormal results are displayed) Labs Reviewed  LIPASE, BLOOD - Abnormal; Notable for the following components:      Result Value   Lipase 56 (*)    All other components within normal limits  COMPREHENSIVE METABOLIC PANEL - Abnormal; Notable for the following components:   Potassium 3.3 (*)    Glucose, Bld 253 (*)    All other components within normal limits  CBC - Abnormal; Notable for the following components:   WBC 11.9 (*)    Hemoglobin 15.1 (*)    Platelets 435 (*)    All other components within normal limits  URINALYSIS, ROUTINE W REFLEX MICROSCOPIC - Abnormal; Notable for the following components:   Glucose, UA >=500 (*)    Hgb urine dipstick SMALL (*)    Ketones, ur 80 (*)    Protein, ur 100 (*)    All other components within normal limits  URINALYSIS, MICROSCOPIC (REFLEX) - Abnormal; Notable for the following components:   Bacteria, UA RARE (*)    All other components within normal limits    EKG EKG Interpretation Date/Time:  Thursday February 03 2023 11:43:08 EST Ventricular Rate:  60 PR Interval:  177 QRS Duration:  102 QT Interval:  522 QTC Calculation: 522 R Axis:   67  Text  Interpretation: Sinus rhythm Consider left atrial enlargement Prolonged QT interval Confirmed by Vonita Moss [id number]) on 02/03/2023 11:55:20 AM  Radiology US Abdomen Limited RUQ (LIVER/GB) Result Date: 02/03/2023 CLINICAL DATA:  Right upper quadrant pain EXAM: ULTRASOUND ABDOMEN LIMITED RIGHT UPPER QUADRANT COMPARISON:  Renal stone CT 06/06/2017 FINDINGS: Gallbladder: No cholelithiasis. Multiple gallbladder polyps, largest 6 mm. No gallbladder wall thickening or pericholecystic fluid. Negative sonographic Murphy's sign. Common bile duct: Diameter: 2.9  mm Liver: There is a 2.4 cm cyst in the liver. There is a 14 cm cyst in the liver. There is a 5.8 cm cyst in the liver. Normal hepatic parenchymal echogenicity. Portal vein is patent on color Doppler imaging with normal direction of blood flow towards the liver. Other: None. IMPRESSION: 1. No cholelithiasis or sonographic evidence for acute cholecystitis. 2. Multiple gallbladder polyps, largest 6 mm. Recommend follow-up ultrasound in 6 months. 3. Hepatic cysts. Electronically Signed   By: Annia Belt M.D.   On: 02/03/2023 14:19    Procedures Procedures    Medications Ordered in ED Medications  ondansetron (ZOFRAN-ODT) disintegrating tablet 4 mg (4 mg Oral Given 02/03/23 1129)  droperidol (INAPSINE) 2.5 MG/ML injection 1.25 mg (1.25 mg Intravenous Given 02/03/23 1142)  diphenhydrAMINE (BENADRYL) injection 25 mg (25 mg Intravenous Given 02/03/23 1143)  potassium chloride SA (KLOR-CON M) CR tablet 40 mEq (40 mEq Oral Given 02/03/23 1452)  prochlorperazine (COMPAZINE) injection 10 mg (10 mg Intravenous Given 02/03/23 1451)    ED Course/ Medical Decision Making/ A&P Clinical Course as of 02/03/23 1926  Thu Feb 03, 2023  1238 Pt feeling much better at this time [RP]  1532 Patient able to tolerate p.o. [RP]    Clinical Course User Index [RP] Rondel Baton, MD                                 Medical Decision Making Amount and/or  Complexity of Data Reviewed Labs: ordered. Radiology: ordered.  Risk Prescription drug management.   AVI FELVER is a 67 y.o. female with comorbidities that complicate the patient evaluation including diabetes on Trulicity, recent C6-T1 discectomy, and frequent marijuana use who presents emergency department with right upper quadrant pain and nausea and vomiting  Initial Ddx:  Cholecystitis, gastritis, gastroenteritis, Trulicity side effect, cannabinoid hyperemesis, SBO  MDM/Course:  Patient presents to the emergency department with abdominal pain and nausea and vomiting.  Did take Trulicity just prior to this occurring which she thinks may be related.  Does also smoke marijuana on a near daily basis.  On exam does have upper abdominal tenderness to palpation.  No abdominal surgeries that would predispose her to an SBO.  Initially was concerned about possible cholecystitis right upper quadrant ultrasound was obtained which did not show evidence of gallstones.  Did show polyps which will require follow-up in 6 months.  Patient informed of this.  She was given droperidol and Benadryl and upon re-evaluation was feeling much better.  Able to tolerate p.o.  Given oral potassium.  Her EKG did unfortunately show that she has a prolonged QT so we will hold off on any additional QT prolonging agents.  Will give her Compazine instead since it has minimal effect on QTc.  She was discharged home with Compazine and instructed to decrease her marijuana use.  Suspect cannabinoid hyperemesis or Trulicity side effect as the cause of her symptoms.  This patient presents to the ED for concern of complaints listed in HPI, this involves an extensive number of treatment options, and is a complaint that carries with it a high risk of complications and morbidity. Disposition including potential need for admission considered.   Dispo: DC Home. Return precautions discussed including, but not limited to, those listed  in the AVS. Allowed pt time to ask questions which were answered fully prior to dc.  Additional history obtained from son Records reviewed Outpatient Clinic Notes The  following labs were independently interpreted: Chemistry and show  mild hypokalemia I independently reviewed the following imaging with scope of interpretation limited to determining acute life threatening conditions related to emergency care:  Right upper quadrant ultrasound  and agree with the radiologist interpretation with the following exceptions: none I personally reviewed and interpreted cardiac monitoring: normal sinus rhythm  I personally reviewed and interpreted the pt's EKG: see above for interpretation  I have reviewed the patients home medications and made adjustments as needed  Portions of this note were generated with Dragon dictation software. Dictation errors may occur despite best attempts at proofreading.     Final Clinical Impression(s) / ED Diagnoses Final diagnoses:  Nausea and vomiting, unspecified vomiting type  Gallbladder polyp    Rx / DC Orders ED Discharge Orders          Ordered    prochlorperazine (COMPAZINE) 10 MG tablet  2 times daily PRN        02/03/23 1426              Rondel Baton, MD 02/03/23 1926

## 2023-02-03 NOTE — ED Triage Notes (Signed)
Pt is here for right upper abdominal pain with radiation to back and chest.  Pt has been having nausea and feels like she has a lumb in her throat.  Pt is also having sweats. Pt reports that this began with nausea around 1am. Pt had back surgery 2 weeks ago.

## 2023-02-18 DIAGNOSIS — K8020 Calculus of gallbladder without cholecystitis without obstruction: Secondary | ICD-10-CM | POA: Diagnosis not present

## 2023-02-18 DIAGNOSIS — R1084 Generalized abdominal pain: Secondary | ICD-10-CM | POA: Diagnosis not present

## 2023-02-18 DIAGNOSIS — K824 Cholesterolosis of gallbladder: Secondary | ICD-10-CM | POA: Diagnosis not present

## 2023-02-18 DIAGNOSIS — R748 Abnormal levels of other serum enzymes: Secondary | ICD-10-CM | POA: Diagnosis not present

## 2023-02-18 DIAGNOSIS — K219 Gastro-esophageal reflux disease without esophagitis: Secondary | ICD-10-CM | POA: Diagnosis not present

## 2023-02-18 DIAGNOSIS — Z860100 Personal history of colon polyps, unspecified: Secondary | ICD-10-CM | POA: Diagnosis not present

## 2023-03-09 DIAGNOSIS — R0683 Snoring: Secondary | ICD-10-CM | POA: Diagnosis not present

## 2023-03-09 DIAGNOSIS — R7303 Prediabetes: Secondary | ICD-10-CM | POA: Diagnosis not present

## 2023-03-09 DIAGNOSIS — Z1330 Encounter for screening examination for mental health and behavioral disorders, unspecified: Secondary | ICD-10-CM | POA: Diagnosis not present

## 2023-03-09 DIAGNOSIS — F325 Major depressive disorder, single episode, in full remission: Secondary | ICD-10-CM | POA: Diagnosis not present

## 2023-03-09 DIAGNOSIS — R400 Somnolence: Secondary | ICD-10-CM | POA: Diagnosis not present

## 2023-03-09 DIAGNOSIS — Z820 Family history of epilepsy and other diseases of the nervous system: Secondary | ICD-10-CM | POA: Diagnosis not present

## 2023-03-10 DIAGNOSIS — K7689 Other specified diseases of liver: Secondary | ICD-10-CM | POA: Diagnosis not present

## 2023-03-15 DIAGNOSIS — R109 Unspecified abdominal pain: Secondary | ICD-10-CM | POA: Diagnosis not present

## 2023-03-23 DIAGNOSIS — R7303 Prediabetes: Secondary | ICD-10-CM | POA: Diagnosis not present

## 2023-03-23 DIAGNOSIS — I10 Essential (primary) hypertension: Secondary | ICD-10-CM | POA: Diagnosis not present

## 2023-03-23 DIAGNOSIS — E782 Mixed hyperlipidemia: Secondary | ICD-10-CM | POA: Diagnosis not present

## 2023-03-23 DIAGNOSIS — Z124 Encounter for screening for malignant neoplasm of cervix: Secondary | ICD-10-CM | POA: Diagnosis not present

## 2023-03-23 DIAGNOSIS — E559 Vitamin D deficiency, unspecified: Secondary | ICD-10-CM | POA: Diagnosis not present

## 2023-04-12 DIAGNOSIS — K811 Chronic cholecystitis: Secondary | ICD-10-CM | POA: Diagnosis not present

## 2023-04-12 DIAGNOSIS — K828 Other specified diseases of gallbladder: Secondary | ICD-10-CM | POA: Diagnosis not present

## 2023-04-12 DIAGNOSIS — G4733 Obstructive sleep apnea (adult) (pediatric): Secondary | ICD-10-CM | POA: Diagnosis not present

## 2023-04-12 DIAGNOSIS — K219 Gastro-esophageal reflux disease without esophagitis: Secondary | ICD-10-CM | POA: Diagnosis not present

## 2023-04-12 DIAGNOSIS — E119 Type 2 diabetes mellitus without complications: Secondary | ICD-10-CM | POA: Diagnosis not present

## 2023-04-12 DIAGNOSIS — I10 Essential (primary) hypertension: Secondary | ICD-10-CM | POA: Diagnosis not present

## 2023-04-12 DIAGNOSIS — J45909 Unspecified asthma, uncomplicated: Secondary | ICD-10-CM | POA: Diagnosis not present

## 2023-04-12 DIAGNOSIS — K7689 Other specified diseases of liver: Secondary | ICD-10-CM | POA: Diagnosis not present

## 2023-04-22 DIAGNOSIS — M79641 Pain in right hand: Secondary | ICD-10-CM | POA: Diagnosis not present

## 2023-04-22 DIAGNOSIS — M79642 Pain in left hand: Secondary | ICD-10-CM | POA: Diagnosis not present

## 2023-04-22 DIAGNOSIS — M65331 Trigger finger, right middle finger: Secondary | ICD-10-CM | POA: Diagnosis not present

## 2023-04-22 DIAGNOSIS — M67442 Ganglion, left hand: Secondary | ICD-10-CM | POA: Diagnosis not present

## 2023-05-10 DIAGNOSIS — R400 Somnolence: Secondary | ICD-10-CM | POA: Diagnosis not present

## 2023-05-10 DIAGNOSIS — R0683 Snoring: Secondary | ICD-10-CM | POA: Diagnosis not present

## 2023-06-14 DIAGNOSIS — R7303 Prediabetes: Secondary | ICD-10-CM | POA: Diagnosis not present

## 2023-06-14 DIAGNOSIS — E119 Type 2 diabetes mellitus without complications: Secondary | ICD-10-CM | POA: Diagnosis not present

## 2023-06-14 DIAGNOSIS — E7800 Pure hypercholesterolemia, unspecified: Secondary | ICD-10-CM | POA: Diagnosis not present

## 2023-06-14 DIAGNOSIS — I10 Essential (primary) hypertension: Secondary | ICD-10-CM | POA: Diagnosis not present

## 2023-06-14 DIAGNOSIS — E559 Vitamin D deficiency, unspecified: Secondary | ICD-10-CM | POA: Diagnosis not present

## 2023-06-23 DIAGNOSIS — H02422 Myogenic ptosis of left eyelid: Secondary | ICD-10-CM | POA: Diagnosis not present

## 2023-06-23 DIAGNOSIS — H02834 Dermatochalasis of left upper eyelid: Secondary | ICD-10-CM | POA: Diagnosis not present

## 2023-06-23 DIAGNOSIS — H57813 Brow ptosis, bilateral: Secondary | ICD-10-CM | POA: Diagnosis not present

## 2023-06-23 DIAGNOSIS — H53483 Generalized contraction of visual field, bilateral: Secondary | ICD-10-CM | POA: Diagnosis not present

## 2023-06-23 DIAGNOSIS — H0279 Other degenerative disorders of eyelid and periocular area: Secondary | ICD-10-CM | POA: Diagnosis not present

## 2023-06-23 DIAGNOSIS — H02413 Mechanical ptosis of bilateral eyelids: Secondary | ICD-10-CM | POA: Diagnosis not present

## 2023-06-23 DIAGNOSIS — H02411 Mechanical ptosis of right eyelid: Secondary | ICD-10-CM | POA: Diagnosis not present

## 2023-06-23 DIAGNOSIS — H02831 Dermatochalasis of right upper eyelid: Secondary | ICD-10-CM | POA: Diagnosis not present

## 2023-06-23 DIAGNOSIS — H02423 Myogenic ptosis of bilateral eyelids: Secondary | ICD-10-CM | POA: Diagnosis not present

## 2023-06-23 DIAGNOSIS — L988 Other specified disorders of the skin and subcutaneous tissue: Secondary | ICD-10-CM | POA: Diagnosis not present

## 2023-06-23 DIAGNOSIS — H02412 Mechanical ptosis of left eyelid: Secondary | ICD-10-CM | POA: Diagnosis not present

## 2023-06-23 DIAGNOSIS — H02421 Myogenic ptosis of right eyelid: Secondary | ICD-10-CM | POA: Diagnosis not present

## 2023-07-27 DIAGNOSIS — M4722 Other spondylosis with radiculopathy, cervical region: Secondary | ICD-10-CM | POA: Diagnosis not present

## 2023-07-27 DIAGNOSIS — M47812 Spondylosis without myelopathy or radiculopathy, cervical region: Secondary | ICD-10-CM | POA: Diagnosis not present

## 2023-07-27 DIAGNOSIS — G894 Chronic pain syndrome: Secondary | ICD-10-CM | POA: Diagnosis not present

## 2023-07-27 DIAGNOSIS — M4802 Spinal stenosis, cervical region: Secondary | ICD-10-CM | POA: Diagnosis not present

## 2023-07-27 DIAGNOSIS — M5030 Other cervical disc degeneration, unspecified cervical region: Secondary | ICD-10-CM | POA: Diagnosis not present

## 2023-08-04 DIAGNOSIS — E1142 Type 2 diabetes mellitus with diabetic polyneuropathy: Secondary | ICD-10-CM | POA: Diagnosis not present

## 2023-08-04 DIAGNOSIS — E1122 Type 2 diabetes mellitus with diabetic chronic kidney disease: Secondary | ICD-10-CM | POA: Diagnosis not present

## 2023-08-04 DIAGNOSIS — E785 Hyperlipidemia, unspecified: Secondary | ICD-10-CM | POA: Diagnosis not present

## 2023-08-04 DIAGNOSIS — Z809 Family history of malignant neoplasm, unspecified: Secondary | ICD-10-CM | POA: Diagnosis not present

## 2023-08-04 DIAGNOSIS — G959 Disease of spinal cord, unspecified: Secondary | ICD-10-CM | POA: Diagnosis not present

## 2023-08-04 DIAGNOSIS — K219 Gastro-esophageal reflux disease without esophagitis: Secondary | ICD-10-CM | POA: Diagnosis not present

## 2023-08-04 DIAGNOSIS — Z87891 Personal history of nicotine dependence: Secondary | ICD-10-CM | POA: Diagnosis not present

## 2023-08-04 DIAGNOSIS — I129 Hypertensive chronic kidney disease with stage 1 through stage 4 chronic kidney disease, or unspecified chronic kidney disease: Secondary | ICD-10-CM | POA: Diagnosis not present

## 2023-08-04 DIAGNOSIS — Z8249 Family history of ischemic heart disease and other diseases of the circulatory system: Secondary | ICD-10-CM | POA: Diagnosis not present

## 2023-08-04 DIAGNOSIS — Z833 Family history of diabetes mellitus: Secondary | ICD-10-CM | POA: Diagnosis not present

## 2023-08-04 DIAGNOSIS — M1990 Unspecified osteoarthritis, unspecified site: Secondary | ICD-10-CM | POA: Diagnosis not present

## 2023-08-04 DIAGNOSIS — F325 Major depressive disorder, single episode, in full remission: Secondary | ICD-10-CM | POA: Diagnosis not present

## 2023-08-12 DIAGNOSIS — Z981 Arthrodesis status: Secondary | ICD-10-CM | POA: Diagnosis not present

## 2023-08-12 DIAGNOSIS — M79642 Pain in left hand: Secondary | ICD-10-CM | POA: Diagnosis not present

## 2023-08-12 DIAGNOSIS — M4722 Other spondylosis with radiculopathy, cervical region: Secondary | ICD-10-CM | POA: Diagnosis not present

## 2023-08-12 DIAGNOSIS — M79641 Pain in right hand: Secondary | ICD-10-CM | POA: Diagnosis not present

## 2023-08-19 DIAGNOSIS — R109 Unspecified abdominal pain: Secondary | ICD-10-CM | POA: Diagnosis not present

## 2023-08-19 DIAGNOSIS — R112 Nausea with vomiting, unspecified: Secondary | ICD-10-CM | POA: Diagnosis not present

## 2023-08-19 DIAGNOSIS — R1084 Generalized abdominal pain: Secondary | ICD-10-CM | POA: Diagnosis not present

## 2023-08-19 DIAGNOSIS — Z9049 Acquired absence of other specified parts of digestive tract: Secondary | ICD-10-CM | POA: Diagnosis not present

## 2023-08-19 DIAGNOSIS — Z79899 Other long term (current) drug therapy: Secondary | ICD-10-CM | POA: Diagnosis not present

## 2023-08-19 DIAGNOSIS — K529 Noninfective gastroenteritis and colitis, unspecified: Secondary | ICD-10-CM | POA: Diagnosis not present

## 2023-08-19 DIAGNOSIS — I10 Essential (primary) hypertension: Secondary | ICD-10-CM | POA: Diagnosis not present

## 2023-08-19 DIAGNOSIS — E119 Type 2 diabetes mellitus without complications: Secondary | ICD-10-CM | POA: Diagnosis not present

## 2023-08-19 DIAGNOSIS — Z7985 Long-term (current) use of injectable non-insulin antidiabetic drugs: Secondary | ICD-10-CM | POA: Diagnosis not present

## 2023-08-19 DIAGNOSIS — K7689 Other specified diseases of liver: Secondary | ICD-10-CM | POA: Diagnosis not present

## 2023-08-19 DIAGNOSIS — R197 Diarrhea, unspecified: Secondary | ICD-10-CM | POA: Diagnosis not present

## 2023-08-24 DIAGNOSIS — M65331 Trigger finger, right middle finger: Secondary | ICD-10-CM | POA: Diagnosis not present

## 2023-08-24 DIAGNOSIS — M5030 Other cervical disc degeneration, unspecified cervical region: Secondary | ICD-10-CM | POA: Diagnosis not present

## 2023-08-24 DIAGNOSIS — Z09 Encounter for follow-up examination after completed treatment for conditions other than malignant neoplasm: Secondary | ICD-10-CM | POA: Diagnosis not present

## 2023-08-24 DIAGNOSIS — G5601 Carpal tunnel syndrome, right upper limb: Secondary | ICD-10-CM | POA: Diagnosis not present

## 2023-08-24 DIAGNOSIS — M5412 Radiculopathy, cervical region: Secondary | ICD-10-CM | POA: Diagnosis not present

## 2023-08-24 DIAGNOSIS — M65341 Trigger finger, right ring finger: Secondary | ICD-10-CM | POA: Diagnosis not present

## 2023-08-24 DIAGNOSIS — G5621 Lesion of ulnar nerve, right upper limb: Secondary | ICD-10-CM | POA: Diagnosis not present

## 2023-08-25 DIAGNOSIS — E559 Vitamin D deficiency, unspecified: Secondary | ICD-10-CM | POA: Diagnosis not present

## 2023-08-25 DIAGNOSIS — E7800 Pure hypercholesterolemia, unspecified: Secondary | ICD-10-CM | POA: Diagnosis not present

## 2023-08-25 DIAGNOSIS — I10 Essential (primary) hypertension: Secondary | ICD-10-CM | POA: Diagnosis not present

## 2023-08-25 DIAGNOSIS — E119 Type 2 diabetes mellitus without complications: Secondary | ICD-10-CM | POA: Diagnosis not present

## 2023-09-20 DIAGNOSIS — R0683 Snoring: Secondary | ICD-10-CM | POA: Diagnosis not present

## 2023-09-20 DIAGNOSIS — G4733 Obstructive sleep apnea (adult) (pediatric): Secondary | ICD-10-CM | POA: Diagnosis not present

## 2023-09-20 DIAGNOSIS — R400 Somnolence: Secondary | ICD-10-CM | POA: Diagnosis not present

## 2023-09-26 DIAGNOSIS — T1490XA Injury, unspecified, initial encounter: Secondary | ICD-10-CM | POA: Diagnosis not present

## 2023-09-27 DIAGNOSIS — G5621 Lesion of ulnar nerve, right upper limb: Secondary | ICD-10-CM | POA: Diagnosis not present

## 2023-09-27 DIAGNOSIS — K219 Gastro-esophageal reflux disease without esophagitis: Secondary | ICD-10-CM | POA: Diagnosis not present

## 2023-09-27 DIAGNOSIS — E785 Hyperlipidemia, unspecified: Secondary | ICD-10-CM | POA: Diagnosis not present

## 2023-09-27 DIAGNOSIS — G4733 Obstructive sleep apnea (adult) (pediatric): Secondary | ICD-10-CM | POA: Diagnosis not present

## 2023-09-27 DIAGNOSIS — F329 Major depressive disorder, single episode, unspecified: Secondary | ICD-10-CM | POA: Diagnosis not present

## 2023-09-27 DIAGNOSIS — J45909 Unspecified asthma, uncomplicated: Secondary | ICD-10-CM | POA: Diagnosis not present

## 2023-09-27 DIAGNOSIS — M65331 Trigger finger, right middle finger: Secondary | ICD-10-CM | POA: Diagnosis not present

## 2023-09-27 DIAGNOSIS — M65341 Trigger finger, right ring finger: Secondary | ICD-10-CM | POA: Diagnosis not present

## 2023-09-27 DIAGNOSIS — I10 Essential (primary) hypertension: Secondary | ICD-10-CM | POA: Diagnosis not present

## 2023-09-27 DIAGNOSIS — Z79899 Other long term (current) drug therapy: Secondary | ICD-10-CM | POA: Diagnosis not present

## 2023-09-28 DIAGNOSIS — R0602 Shortness of breath: Secondary | ICD-10-CM | POA: Diagnosis not present

## 2023-09-28 DIAGNOSIS — R7989 Other specified abnormal findings of blood chemistry: Secondary | ICD-10-CM | POA: Diagnosis not present

## 2023-09-28 DIAGNOSIS — R0789 Other chest pain: Secondary | ICD-10-CM | POA: Diagnosis not present

## 2023-09-28 DIAGNOSIS — R9431 Abnormal electrocardiogram [ECG] [EKG]: Secondary | ICD-10-CM | POA: Diagnosis not present

## 2023-09-28 DIAGNOSIS — R079 Chest pain, unspecified: Secondary | ICD-10-CM | POA: Diagnosis not present

## 2023-09-28 DIAGNOSIS — R109 Unspecified abdominal pain: Secondary | ICD-10-CM | POA: Diagnosis not present

## 2023-09-28 DIAGNOSIS — R059 Cough, unspecified: Secondary | ICD-10-CM | POA: Diagnosis not present

## 2023-12-23 ENCOUNTER — Other Ambulatory Visit: Payer: Self-pay | Admitting: Medical Genetics

## 2023-12-23 ENCOUNTER — Encounter (INDEPENDENT_AMBULATORY_CARE_PROVIDER_SITE_OTHER): Payer: Self-pay

## 2024-01-25 ENCOUNTER — Other Ambulatory Visit

## 2024-03-22 ENCOUNTER — Other Ambulatory Visit: Payer: Self-pay | Admitting: Medical Genetics

## 2024-03-22 DIAGNOSIS — Z006 Encounter for examination for normal comparison and control in clinical research program: Secondary | ICD-10-CM
